# Patient Record
Sex: Male | Born: 1965 | Race: White | Hispanic: No | Marital: Married | Smoking: Never smoker
Health system: Southern US, Community
[De-identification: ages and names within clinical notes are randomized; demographics above are authoritative.]

## PROBLEM LIST (undated history)

## (undated) DIAGNOSIS — R82994 Hypercalciuria: Secondary | ICD-10-CM

## (undated) DIAGNOSIS — N200 Calculus of kidney: Secondary | ICD-10-CM

## (undated) DIAGNOSIS — M8580 Other specified disorders of bone density and structure, unspecified site: Secondary | ICD-10-CM

## (undated) HISTORY — DX: Other specified disorders of bone density and structure, unspecified site: M85.80

## (undated) HISTORY — DX: Hypercalciuria: R82.994

## (undated) HISTORY — DX: Calculus of kidney: N20.0

## (undated) HISTORY — PX: WISDOM TOOTH EXTRACTION: SHX21

## (undated) HISTORY — PX: APPENDECTOMY: SHX54

## (undated) HISTORY — PX: VASECTOMY: SHX75

---

## 2006-12-07 ENCOUNTER — Ambulatory Visit: Payer: Self-pay | Admitting: Family Medicine

## 2006-12-09 ENCOUNTER — Ambulatory Visit: Payer: Self-pay | Admitting: Family Medicine

## 2006-12-09 LAB — CONVERTED CEMR LAB
AST: 16 units/L (ref 0–37)
Albumin: 4.4 g/dL (ref 3.5–5.2)
Basophils Absolute: 0 10*3/uL (ref 0.0–0.1)
Chloride: 102 meq/L (ref 96–112)
Eosinophils Absolute: 0.3 10*3/uL (ref 0.0–0.6)
GFR calc non Af Amer: 99 mL/min
HCT: 45.9 % (ref 39.0–52.0)
MCHC: 33.6 g/dL (ref 30.0–36.0)
MCV: 92 fL (ref 78.0–100.0)
Neutrophils Relative %: 61.5 % (ref 43.0–77.0)
Platelets: 206 10*3/uL (ref 150–400)
RBC: 4.99 M/uL (ref 4.22–5.81)
Sodium: 138 meq/L (ref 135–145)
TSH: 1.08 microintl units/mL (ref 0.35–5.50)

## 2007-06-19 ENCOUNTER — Encounter: Payer: Self-pay | Admitting: Family Medicine

## 2008-08-07 ENCOUNTER — Encounter: Payer: Self-pay | Admitting: Family Medicine

## 2008-09-04 ENCOUNTER — Ambulatory Visit: Payer: Self-pay | Admitting: Family Medicine

## 2008-09-04 DIAGNOSIS — J209 Acute bronchitis, unspecified: Secondary | ICD-10-CM

## 2010-12-08 NOTE — Assessment & Plan Note (Signed)
[facility]                            [facility] OFFICE NOTE   NAME:[name], [name]                      MRN:          [id number]  DATE:12/07/2006                            DOB:          May 16, 1966    HISTORY OF PRESENT ILLNESS:  This is a 45 year old gentleman here to  establish with our practice. He has seen a physician in several years.  He has a couple of skin lesions that he would like me to check.  Otherwise, he feels fine. He has had 2 firm small lesions on the left  lower leg that have been present for many years. They do not bother him  and they do not seem to change. Also for many years, he has had a small  non-symptomatic lump on his scrotum, which does not appear to be  changing.   PAST MEDICAL HISTORY:  He has been quite healthy all his life. He has  never been hospitalized.   PAST SURGICAL HISTORY:  Vasectomy in 2002.   ALLERGIES:  NO KNOWN DRUG ALLERGIES.   CURRENT MEDICATIONS:  None by prescription. He does take fish oil  supplements and a multivitamin daily.   HABITS:  He drinks some alcohol but does not use tobacco.   SOCIAL HISTORY:  He is married with 2 children. 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   FAMILY HISTORY:  Remarkable for his father suddenly dying of a heart  attack at the age of 65. To his knowledge, his father had no prior risk  factors for heart disease whatsoever. His mother has hypertension. No  family history of diabetes or cancer.   OBJECTIVE:  VITAL SIGNS:  Height 6 foot 0 inches. Weight 172. Blood  pressure 122/82. Pulse 88 and regular.  GENERAL:  He appears to be quite healthy.  SKIN:  Free of significant lesions. He does have 2 small non-tender,  firm, dark brown lesions on his left lower leg, consistent with  dermatofibromas.  HEENT:  Eyes clear. Ears clear. Pharynx clear.  NECK:  Supple without lymphadenopathy or masses.  LUNGS:  Clear.  CARDIOVASCULAR:  Regular rate and  rhythm. Without murmur, rub, or  gallop. Distal pulses are full.  ABDOMEN:  Soft with normal bowel sounds. Non-tender, no masses.  GENITALIA:  Normal male. He is circumcised. He does have a small non-  tender well circumscribed round cystic lesion, must under the skin on  the left side of the scrotum.  EXTREMITIES:  No clubbing, cyanosis, or edema.  NEUROLOGIC:  Examination is grossly intact.   ASSESSMENT/PLAN:  1. Complete physical. He will schedule soon in a fasting state, to      have the usual laboratories performed.  2. 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 I told him that these were      quite benign and he could observe them only.  3. Sebaceous cyst on the scrotum. I told him that this was also quite      benign and no interventions were necessary. He said that he might      consider having it removed  for his peace of mind at some point and      when he decides to do that, we will set up a referral to urology.     Tera Mater. Clent Ridges, MD     SAF/MedQ  DD: 12/07/2006  DT: 12/07/2006  Job #: [id number]

## 2012-10-17 ENCOUNTER — Emergency Department (HOSPITAL_COMMUNITY): Payer: Commercial Managed Care - PPO

## 2012-10-17 ENCOUNTER — Encounter (HOSPITAL_COMMUNITY): Payer: Self-pay

## 2012-10-17 ENCOUNTER — Inpatient Hospital Stay (HOSPITAL_COMMUNITY)
Admission: EM | Admit: 2012-10-17 | Discharge: 2012-10-23 | DRG: 340 | Disposition: A | Discharge status: Home / Self Care | Payer: Commercial Managed Care - PPO | Attending: Surgery | Admitting: Surgery

## 2012-10-17 DIAGNOSIS — Z8709 Personal history of other diseases of the respiratory system: Secondary | ICD-10-CM

## 2012-10-17 DIAGNOSIS — K3533 Acute appendicitis with perforation and localized peritonitis, with abscess: Principal | ICD-10-CM | POA: Diagnosis present

## 2012-10-17 DIAGNOSIS — Z9049 Acquired absence of other specified parts of digestive tract: Secondary | ICD-10-CM

## 2012-10-17 DIAGNOSIS — K3580 Unspecified acute appendicitis: Secondary | ICD-10-CM

## 2012-10-17 DIAGNOSIS — D72829 Elevated white blood cell count, unspecified: Secondary | ICD-10-CM | POA: Diagnosis present

## 2012-10-17 LAB — COMPREHENSIVE METABOLIC PANEL
Alkaline Phosphatase: 60 U/L (ref 39–117)
BUN: 8 mg/dL (ref 6–23)
CO2: 30 mEq/L (ref 19–32)
Chloride: 99 mEq/L (ref 96–112)
Creatinine, Ser: 0.93 mg/dL (ref 0.50–1.35)
GFR calc non Af Amer: >90 mL/min (ref >90)
Glucose, Bld: 119 mg/dL — ABNORMAL HIGH (ref 70–99)
Total Bilirubin: 1 mg/dL (ref 0.3–1.2)

## 2012-10-17 LAB — CBC WITH DIFFERENTIAL/PLATELET
HCT: 42.9 % (ref 39.0–52.0)
Hemoglobin: 15.1 g/dL (ref 13.0–17.0)
Lymphocytes Relative: 6 % — ABNORMAL LOW (ref 12–46)
Lymphs Abs: 1.1 10*3/uL (ref 0.7–4.0)
MCHC: 35.2 g/dL (ref 30.0–36.0)
Monocytes Absolute: 1.6 10*3/uL — ABNORMAL HIGH (ref 0.1–1.0)
Monocytes Relative: 8 % (ref 3–12)
Neutro Abs: 16.7 10*3/uL — ABNORMAL HIGH (ref 1.7–7.7)
RBC: 4.85 MIL/uL (ref 4.22–5.81)
WBC: 19.5 10*3/uL — ABNORMAL HIGH (ref 4.0–10.5)

## 2012-10-17 LAB — LIPASE, BLOOD: Lipase: 18 U/L (ref 11–59)

## 2012-10-17 LAB — URINALYSIS, MICROSCOPIC ONLY
Bilirubin Urine: NEGATIVE
Ketones, ur: 15 mg/dL — AB
Leukocytes, UA: NEGATIVE
Nitrite: NEGATIVE
Protein, ur: 30 mg/dL — AB

## 2012-10-17 MED ORDER — ONDANSETRON HCL 4 MG/2ML IJ SOLN
4.0000 mg | Freq: Once | INTRAMUSCULAR | Status: AC
  Administered 2012-10-17: 4 mg via INTRAVENOUS
  Filled 2012-10-17: qty 2

## 2012-10-17 MED ORDER — MORPHINE SULFATE 4 MG/ML IJ SOLN
4.0000 mg | Freq: Once | INTRAMUSCULAR | Status: AC
  Administered 2012-10-17: 4 mg via INTRAVENOUS
  Filled 2012-10-17: qty 1

## 2012-10-17 MED ORDER — IOHEXOL 300 MG/ML  SOLN
50.0000 mL | Freq: Once | INTRAMUSCULAR | Status: AC | PRN
  Administered 2012-10-17: 50 mL via ORAL

## 2012-10-17 MED ORDER — IOHEXOL 300 MG/ML  SOLN
100.0000 mL | Freq: Once | INTRAMUSCULAR | Status: AC | PRN
  Administered 2012-10-17: 100 mL via INTRAVENOUS

## 2012-10-17 MED ORDER — CEFOXITIN SODIUM 1 G IV SOLR
1.0000 g | Freq: Once | INTRAVENOUS | Status: AC
  Administered 2012-10-18: 1 g via INTRAVENOUS
  Filled 2012-10-17: qty 1

## 2012-10-17 NOTE — ED Notes (Signed)
Patient presents with c/o abdominal pain and generalized malaise. Reports that he ate eggs and toast for dinner 2 nights ago. Immediately afterwards, had epigastric pain, nausea and vomiting x 2. Since then, pain has migrated to RLQ. No n/v, diarrhea or constipation. Patient has also felt "achy all over" with headaches, intermittent fevers, sweats and chills relieved by Ibuprofen. No appetite but has been eating and drinking small amounts.

## 2012-10-17 NOTE — ED Provider Notes (Signed)
History     CSN: [id number]  Arrival date & time 10/17/12  1912   First MD Initiated Contact with Patient 10/17/12 2049      Chief Complaint  Patient presents with  . Abdominal Pain    Patient is a 47 y.o. male presenting with abdominal pain. The history is provided by the patient.  Abdominal Pain Pain location:  RLQ Pain quality: aching   Pain radiates to:  Does not radiate Pain severity:  Moderate Onset quality:  Gradual Duration:  2 days Timing:  Constant Progression:  Worsening Chronicity:  New Relieved by: rest. Worsened by:  Palpation Associated symptoms: chills, fever, nausea and vomiting   Associated symptoms: no chest pain, no constipation, no cough, no diarrhea and no dysuria   pt reports pain started in epigastric region and now has migrated to RLQ He has never had this before He reports fever at home  PMH - none Surgical History - none   No family history on file.  History  Substance Use Topics  . Smoking status: Never Smoker   . Smokeless tobacco: Never Used  . Alcohol Use: Yes     Comment: occasional       Review of Systems  Constitutional: Positive for fever and chills.  Respiratory: Negative for cough.   Cardiovascular: Negative for chest pain.  Gastrointestinal: Positive for nausea, vomiting and abdominal pain. Negative for diarrhea and constipation.  Genitourinary: Negative for dysuria.  Neurological: Positive for headaches. Negative for weakness.  Psychiatric/Behavioral: Negative for agitation.  All other systems reviewed and are negative.    Allergies  Review of patient's allergies indicates no known allergies.  Home Medications   Current Outpatient Rx  Name  Route  Sig  Dispense  Refill  . ibuprofen (ADVIL,MOTRIN) 200 MG tablet   Oral   Take 400 mg by mouth every 6 (six) hours as needed for pain.           BP 114/71  Pulse 109  Temp(Src) 98.4 F (36.9 C) (Oral)  Resp 18  SpO2 98% BP 107/59  Pulse 86  Temp(Src)  98.4 F (36.9 C) (Oral)  Resp 18  SpO2 94%   Physical Exam CONSTITUTIONAL: Well developed/well nourished HEAD: Normocephalic/atraumatic EYES: EOMI/PERRL ENMT: Mucous membranes dry NECK: supple no meningeal signs CV: S1/S2 noted, no murmurs/rubs/gallops noted LUNGS: Lungs are clear to auscultation bilaterally, no apparent distress ABDOMEN: soft, moderate RLQ tenderness, no rebound or guarding GU:no cva tenderness, no inguinal hernia noted NEURO: Pt is awake/alert, moves all extremitiesx4 EXTREMITIES: pulses normal, full ROM SKIN: warm, color normal PSYCH: no abnormalities of mood noted  ED Course  Procedures  Labs Reviewed  CBC WITH DIFFERENTIAL - Abnormal; Notable for the following:    WBC 19.5 (*)    Neutrophils Relative 86 (*)    Neutro Abs 16.7 (*)    Lymphocytes Relative 6 (*)    Monocytes Absolute 1.6 (*)    All other components within normal limits  COMPREHENSIVE METABOLIC PANEL - Abnormal; Notable for the following:    Glucose, Bld 119 (*)    All other components within normal limits  LIPASE, BLOOD  URINALYSIS, MICROSCOPIC ONLY   9:12 PM Pt with focal RLQ tenderness, elevated WBC, will need CT imaging Pt agreeable He is otherwise stable at this time 10:53 PM Pt reports improvement in pain Ct imaging pending currently 11:57 PM I spoke to radiology concerning this patient CT imaging shows appendicitis I spoke to dr Dwain Sarna, will see patient.  He recommend  cefoxitin I informed patient of CT imaging and plan for admission Pt agreeable  MDM  Nursing notes including past medical history and social history reviewed and considered in documentation Labs/vital reviewed and considered         Joya Gaskins, MD 10/17/12 2358

## 2012-10-18 ENCOUNTER — Encounter (HOSPITAL_COMMUNITY): Payer: Self-pay | Admitting: *Deleted

## 2012-10-18 ENCOUNTER — Encounter (HOSPITAL_COMMUNITY): Payer: Self-pay | Admitting: Anesthesiology

## 2012-10-18 ENCOUNTER — Inpatient Hospital Stay (HOSPITAL_COMMUNITY): Payer: Commercial Managed Care - PPO | Admitting: Anesthesiology

## 2012-10-18 ENCOUNTER — Encounter (HOSPITAL_COMMUNITY): Admission: EM | Disposition: A | Discharge status: Home / Self Care | Payer: Self-pay | Source: Home / Self Care

## 2012-10-18 DIAGNOSIS — K3580 Unspecified acute appendicitis: Secondary | ICD-10-CM

## 2012-10-18 HISTORY — PX: LAPAROSCOPIC APPENDECTOMY: SHX408

## 2012-10-18 SURGERY — APPENDECTOMY, LAPAROSCOPIC
Anesthesia: General | Site: Abdomen | Wound class: Dirty or Infected

## 2012-10-18 MED ORDER — OXYCODONE HCL 5 MG/5ML PO SOLN: 5.0000 mg | Freq: Once | ORAL | Status: DC | PRN

## 2012-10-18 MED ORDER — MIDAZOLAM HCL 5 MG/5ML IJ SOLN
INTRAMUSCULAR | Status: DC | PRN
  Administered 2012-10-18: 2 mg via INTRAVENOUS

## 2012-10-18 MED ORDER — LACTATED RINGERS IV SOLN
INTRAVENOUS | Status: DC
  Administered 2012-10-18: 09:00:00 via INTRAVENOUS

## 2012-10-18 MED ORDER — LIDOCAINE HCL (CARDIAC) 20 MG/ML IV SOLN
INTRAVENOUS | Status: DC | PRN
  Administered 2012-10-18: 100 mg via INTRAVENOUS

## 2012-10-18 MED ORDER — BUPIVACAINE-EPINEPHRINE 0.25% -1:200000 IJ SOLN
INTRAMUSCULAR | Status: DC | PRN
  Administered 2012-10-18: 30 mL

## 2012-10-18 MED ORDER — ACETAMINOPHEN 650 MG RE SUPP: 650.0000 mg | Freq: Four times a day (QID) | RECTAL | Status: DC | PRN

## 2012-10-18 MED ORDER — SODIUM CHLORIDE 0.9 % IV SOLN
INTRAVENOUS | Status: DC
  Administered 2012-10-18 – 2012-10-22 (×6): via INTRAVENOUS

## 2012-10-18 MED ORDER — LACTATED RINGERS IV SOLN
INTRAVENOUS | Status: DC | PRN
  Administered 2012-10-18 (×2): via INTRAVENOUS

## 2012-10-18 MED ORDER — ARTIFICIAL TEARS OP OINT
TOPICAL_OINTMENT | OPHTHALMIC | Status: DC | PRN
  Administered 2012-10-18: 1 via OPHTHALMIC

## 2012-10-18 MED ORDER — NEOSTIGMINE METHYLSULFATE 1 MG/ML IJ SOLN
INTRAMUSCULAR | Status: DC | PRN
  Administered 2012-10-18: 5 mg via INTRAVENOUS

## 2012-10-18 MED ORDER — HYDROMORPHONE HCL PF 1 MG/ML IJ SOLN
INTRAMUSCULAR | Status: DC | PRN
  Administered 2012-10-18 (×4): .25 mg via INTRAVENOUS

## 2012-10-18 MED ORDER — ONDANSETRON HCL 4 MG/2ML IJ SOLN: 4.0000 mg | Freq: Four times a day (QID) | INTRAMUSCULAR | Status: DC | PRN

## 2012-10-18 MED ORDER — ENOXAPARIN SODIUM 40 MG/0.4ML ~~LOC~~ SOLN
40.0000 mg | SUBCUTANEOUS | Status: DC
  Administered 2012-10-19 – 2012-10-22 (×4): 40 mg via SUBCUTANEOUS
  Filled 2012-10-18 (×6): qty 0.4

## 2012-10-18 MED ORDER — IBUPROFEN 400 MG PO TABS
400.0000 mg | ORAL_TABLET | Freq: Four times a day (QID) | ORAL | Status: DC | PRN
  Administered 2012-10-20 – 2012-10-22 (×7): 400 mg via ORAL
  Filled 2012-10-18 (×7): qty 1

## 2012-10-18 MED ORDER — ACETAMINOPHEN 10 MG/ML IV SOLN
INTRAVENOUS | Status: AC
  Filled 2012-10-18: qty 100

## 2012-10-18 MED ORDER — ROCURONIUM BROMIDE 100 MG/10ML IV SOLN
INTRAVENOUS | Status: DC | PRN
  Administered 2012-10-18: 50 mg via INTRAVENOUS

## 2012-10-18 MED ORDER — OXYCODONE HCL 5 MG PO TABS
5.0000 mg | ORAL_TABLET | ORAL | Status: DC | PRN
  Administered 2012-10-21 – 2012-10-22 (×4): 5 mg via ORAL
  Filled 2012-10-18 (×4): qty 1

## 2012-10-18 MED ORDER — ONDANSETRON HCL 4 MG/2ML IJ SOLN
4.0000 mg | Freq: Four times a day (QID) | INTRAMUSCULAR | Status: DC | PRN
  Administered 2012-10-18 – 2012-10-21 (×6): 4 mg via INTRAVENOUS
  Filled 2012-10-18 (×6): qty 2

## 2012-10-18 MED ORDER — BUPIVACAINE HCL (PF) 0.25 % IJ SOLN
INTRAMUSCULAR | Status: AC
  Filled 2012-10-18: qty 30

## 2012-10-18 MED ORDER — SODIUM CHLORIDE 0.9 % IR SOLN
Status: DC | PRN
  Administered 2012-10-18: 1000 mL

## 2012-10-18 MED ORDER — GLYCOPYRROLATE 0.2 MG/ML IJ SOLN
INTRAMUSCULAR | Status: DC | PRN
  Administered 2012-10-18: .8 mg via INTRAVENOUS

## 2012-10-18 MED ORDER — MORPHINE SULFATE 2 MG/ML IJ SOLN
2.0000 mg | INTRAMUSCULAR | Status: DC | PRN
  Administered 2012-10-18 – 2012-10-19 (×10): 2 mg via INTRAVENOUS
  Filled 2012-10-18 (×11): qty 1

## 2012-10-18 MED ORDER — DEXAMETHASONE SODIUM PHOSPHATE 10 MG/ML IJ SOLN
INTRAMUSCULAR | Status: DC | PRN
  Administered 2012-10-18: 8 mg via INTRAVENOUS

## 2012-10-18 MED ORDER — ACETAMINOPHEN 325 MG PO TABS
650.0000 mg | ORAL_TABLET | Freq: Four times a day (QID) | ORAL | Status: DC | PRN
  Administered 2012-10-18: 1000 mg via ORAL
  Administered 2012-10-18 – 2012-10-20 (×2): 650 mg via ORAL
  Filled 2012-10-18 (×2): qty 2

## 2012-10-18 MED ORDER — PIPERACILLIN-TAZOBACTAM 3.375 G IVPB
3.3750 g | Freq: Three times a day (TID) | INTRAVENOUS | Status: DC
  Administered 2012-10-18 – 2012-10-23 (×15): 3.375 g via INTRAVENOUS
  Filled 2012-10-18 (×18): qty 50

## 2012-10-18 MED ORDER — BUPIVACAINE-EPINEPHRINE PF 0.25-1:200000 % IJ SOLN
INTRAMUSCULAR | Status: AC
  Filled 2012-10-18: qty 30

## 2012-10-18 MED ORDER — ONDANSETRON HCL 4 MG/2ML IJ SOLN
INTRAMUSCULAR | Status: DC | PRN
  Administered 2012-10-18: 4 mg via INTRAVENOUS

## 2012-10-18 MED ORDER — HYDROMORPHONE HCL PF 1 MG/ML IJ SOLN
0.2500 mg | INTRAMUSCULAR | Status: DC | PRN
Start: 2012-10-18 — End: 2012-10-18
  Administered 2012-10-18 (×2): 0.5 mg via INTRAVENOUS

## 2012-10-18 MED ORDER — OXYCODONE HCL 5 MG PO TABS: 5.0000 mg | ORAL_TABLET | Freq: Once | ORAL | Status: DC | PRN

## 2012-10-18 MED ORDER — HYDROMORPHONE HCL PF 1 MG/ML IJ SOLN
INTRAMUSCULAR | Status: AC
  Filled 2012-10-18: qty 1

## 2012-10-18 MED ORDER — PROPOFOL 10 MG/ML IV BOLUS
INTRAVENOUS | Status: DC | PRN
  Administered 2012-10-18: 150 mg via INTRAVENOUS
  Administered 2012-10-18 (×2): 50 mg via INTRAVENOUS

## 2012-10-18 MED ORDER — WHITE PETROLATUM GEL
Status: AC
  Administered 2012-10-18: 23:00:00
  Filled 2012-10-18: qty 5

## 2012-10-18 MED ORDER — FENTANYL CITRATE 0.05 MG/ML IJ SOLN
INTRAMUSCULAR | Status: DC | PRN
  Administered 2012-10-18 (×3): 50 ug via INTRAVENOUS
  Administered 2012-10-18: 100 ug via INTRAVENOUS

## 2012-10-18 SURGICAL SUPPLY — 54 items
ADH SKN CLS APL DERMABOND .7 (GAUZE/BANDAGES/DRESSINGS) ×1
APPLIER CLIP ROT 10 11.4 M/L (STAPLE)
APR CLP MED LRG 11.4X10 (STAPLE)
BAG SPEC RTRVL LRG 6X4 10 (ENDOMECHANICALS) ×1
BLADE SURG ROTATE 9660 (MISCELLANEOUS) ×1 IMPLANT
CANISTER SUCTION 2500CC (MISCELLANEOUS) ×2 IMPLANT
CHLORAPREP W/TINT 26ML (MISCELLANEOUS) ×2 IMPLANT
CLIP APPLIE ROT 10 11.4 M/L (STAPLE) IMPLANT
CLOTH BEACON ORANGE TIMEOUT ST (SAFETY) ×2 IMPLANT
COVER SURGICAL LIGHT HANDLE (MISCELLANEOUS) ×2 IMPLANT
CUTTER LINEAR ENDO 35 ETS (STAPLE) IMPLANT
CUTTER LINEAR ENDO 35 ETS TH (STAPLE) ×1 IMPLANT
DECANTER SPIKE VIAL GLASS SM (MISCELLANEOUS) ×2 IMPLANT
DERMABOND ADVANCED (GAUZE/BANDAGES/DRESSINGS) ×1
DERMABOND ADVANCED .7 DNX12 (GAUZE/BANDAGES/DRESSINGS) ×1 IMPLANT
DRAIN CHANNEL 19F RND (DRAIN) ×1 IMPLANT
DRAPE UTILITY 15X26 W/TAPE STR (DRAPE) ×4 IMPLANT
ELECT REM PT RETURN 9FT ADLT (ELECTROSURGICAL) ×2
ELECTRODE REM PT RTRN 9FT ADLT (ELECTROSURGICAL) ×1 IMPLANT
ENDOLOOP SUT PDS II  0 18 (SUTURE)
ENDOLOOP SUT PDS II 0 18 (SUTURE) IMPLANT
EVACUATOR SILICONE 100CC (DRAIN) ×1 IMPLANT
GLOVE BIO SURGEON STRL SZ7.5 (GLOVE) ×1 IMPLANT
GLOVE BIO SURGEON STRL SZ8 (GLOVE) ×2 IMPLANT
GLOVE BIOGEL PI IND STRL 7.0 (GLOVE) ×1 IMPLANT
GLOVE BIOGEL PI IND STRL 7.5 (GLOVE) IMPLANT
GLOVE BIOGEL PI IND STRL 8 (GLOVE) ×1 IMPLANT
GLOVE BIOGEL PI INDICATOR 7.0 (GLOVE) ×1
GLOVE BIOGEL PI INDICATOR 7.5 (GLOVE) ×1
GLOVE BIOGEL PI INDICATOR 8 (GLOVE) ×1
GOWN PREVENTION PLUS XLARGE (GOWN DISPOSABLE) ×2 IMPLANT
GOWN STRL NON-REIN LRG LVL3 (GOWN DISPOSABLE) ×4 IMPLANT
KIT BASIN OR (CUSTOM PROCEDURE TRAY) ×2 IMPLANT
KIT ROOM TURNOVER OR (KITS) ×2 IMPLANT
NS IRRIG 1000ML POUR BTL (IV SOLUTION) ×2 IMPLANT
PAD ARMBOARD 7.5X6 YLW CONV (MISCELLANEOUS) ×4 IMPLANT
POUCH SPECIMEN RETRIEVAL 10MM (ENDOMECHANICALS) ×2 IMPLANT
RELOAD /EVU35 (ENDOMECHANICALS) IMPLANT
RELOAD CUTTER ETS 35MM STAND (ENDOMECHANICALS) IMPLANT
SCALPEL HARMONIC ACE (MISCELLANEOUS) ×2 IMPLANT
SET IRRIG TUBING LAPAROSCOPIC (IRRIGATION / IRRIGATOR) ×2 IMPLANT
SPECIMEN JAR SMALL (MISCELLANEOUS) ×2 IMPLANT
SUT ETHILON 2 0 FS 18 (SUTURE) ×1 IMPLANT
SUT VIC AB 4-0 PS2 27 (SUTURE) ×2 IMPLANT
SWAB COLLECTION DEVICE MRSA (MISCELLANEOUS) ×1 IMPLANT
TOWEL OR 17X24 6PK STRL BLUE (TOWEL DISPOSABLE) ×2 IMPLANT
TOWEL OR 17X26 10 PK STRL BLUE (TOWEL DISPOSABLE) ×2 IMPLANT
TRAY FOLEY CATH 14FR (SET/KITS/TRAYS/PACK) ×2 IMPLANT
TRAY LAPAROSCOPIC (CUSTOM PROCEDURE TRAY) ×2 IMPLANT
TROCAR HASSON GELL 12X100 (TROCAR) ×2 IMPLANT
TROCAR Z-THREAD FIOS 12X100MM (TROCAR) ×2 IMPLANT
TROCAR Z-THREAD FIOS 5X100MM (TROCAR) ×2 IMPLANT
TUBE ANAEROBIC SPECIMEN COL (MISCELLANEOUS) ×1 IMPLANT
WATER STERILE IRR 1000ML POUR (IV SOLUTION) ×1 IMPLANT

## 2012-10-18 NOTE — Op Note (Addendum)
10/17/2012 - 10/18/2012  10:49 AM  PATIENT:  [name]  47 y.o. male  PRE-OPERATIVE DIAGNOSIS:  Perforated appendicitis  POST-OPERATIVE DIAGNOSIS:  Perforated appendicitis  PROCEDURE:  Procedure(s): APPENDECTOMY LAPAROSCOPIC DRAINAGE INTRA-ABDOMINAL ABSCESS  SURGEON:  Surgeon(s): Liz Malady, MD  PHYSICIAN ASSISTANT:   ASSISTANTS: none   ANESTHESIA:   local and general  EBL:  Total I/O In: 1000 [I.V.:1000] Out: 300 [Urine:250; Blood:50]  BLOOD ADMINISTERED:none  DRAINS: (1) Jackson-Pratt drain(s) with closed bulb suction in the rlq   SPECIMEN:  Excision  DISPOSITION OF SPECIMEN:  PATHOLOGY + culture to micro  COUNTS:  YES  DICTATION: .Dragon Dictation Patient was admitted with perforated appendicitis with small abscess. He is brought for arthroscopic appendectomy. Informed consent was obtained. He is on intravenous antibiotic protocol. He was identified in the preop holding area. He was brought to the operating room and general endotracheal anesthesia was a Optician, dispensing by the anesthesia staff. His abdomen was prepped and draped in sterile fashion after Foley catheter was placed by nursing. Time out procedure was done. Infraumbilical area was infiltrated with quarter percent Marcaine with epinephrine. Infraumbilical incision was made. Subcutaneous tissues were dissected down revealing the anterior fascia. This was divided along the midline and the peritoneal cavity was entered under direct vision. 0 Vicryl purse string suture was placed on the fascial opening. Hassan trocar was inserted. Abdomen was insufflated with carbon dioxide in standard fashion. Under direct vision, a 12 mm left lower quadrant and a 5 mm right mid abdomen port were placed. Local was used at each port site. Laparoscopic exploration revealed a water omentum down covering the cecum in the right lower quadrant. This was freed up entering an abscess cavity surrounding a necrotic appendicitis with  perforation one third of the way up from the base. Gross contamination was irrigated away. The base of the appendix was viable and intact. The mesoappendix was divided with harmonic scalpel achieving excellent hemostasis. Pacing the appendix was divided with Endo GIA with vascular load. Excellent staple line closure was achieved. Appendix was placed in an Endo Catch bag and removed from the abdomen via the left lower quadrant port site. Abdomen was then copiously irrigated with several liters of saline. Area remained hemostatic. Staple line remained intact. 19 [place] Blake drain was placed in the right lower quadrant and brought out through the right sided port site. Cultures of the appendix were sent. Ports were removed under direct vision. Pneumoperitoneum was released. The umbilical fascia was closed by tying the 0 Vicryl pursestring suture with care not to trap any intra-abdominal contents. Remaining 2 incisions were copiously irrigated and the skin of each was closed with running 4 Vicryl followed by Dermabond. All counts were correct. Patient tolerated procedure well without apparent complication was taken recovery in stable condition.  PATIENT DISPOSITION:  PACU - hemodynamically stable.   Delay start of Pharmacological VTE agent (>24hrs) due to surgical blood loss or risk of bleeding:  no  Violeta Gelinas, MD, MPH, FACS Pager: [phone]  3/26/201410:49 AM

## 2012-10-18 NOTE — Anesthesia Preprocedure Evaluation (Addendum)
Anesthesia Evaluation  Patient identified by MRN, date of birth, ID band Patient awake    Reviewed: Allergy & Precautions, H&P , NPO status , Patient's Chart, lab work & pertinent test results  Airway Mallampati: II  Neck ROM: full    Dental   Pulmonary          Cardiovascular     Neuro/Psych    GI/Hepatic Perforated appendicitis   Endo/Other    Renal/GU      Musculoskeletal   Abdominal   Peds  Hematology   Anesthesia Other Findings   Reproductive/Obstetrics                           Anesthesia Physical Anesthesia Plan  ASA: I  Anesthesia Plan: General   Post-op Pain Management:    Induction: Intravenous  Airway Management Planned: Oral ETT  Additional Equipment:   Intra-op Plan:   Post-operative Plan: Extubation in OR  Informed Consent: I have reviewed the patients History and Physical, chart, labs and discussed the procedure including the risks, benefits and alternatives for the proposed anesthesia with the patient or authorized representative who has indicated his/her understanding and acceptance.     Plan Discussed with: CRNA and Surgeon  Anesthesia Plan Comments:         Anesthesia Quick Evaluation

## 2012-10-18 NOTE — Progress Notes (Signed)
UR completed 

## 2012-10-18 NOTE — Transfer of Care (Signed)
Immediate Anesthesia Transfer of Care Note  Patient: [name]  Procedure(s) Performed: Procedure(s): APPENDECTOMY LAPAROSCOPIC (N/A)  Patient Location: PACU  Anesthesia Type:General  Level of Consciousness: oriented and sedated  Airway & Oxygen Therapy: Patient Spontanous Breathing and Patient connected to nasal cannula oxygen  Post-op Assessment: Report given to PACU RN, Post -op Vital signs reviewed and stable and Patient moving all extremities X 4  Post vital signs: Reviewed and stable  Complications: No apparent anesthesia complications

## 2012-10-18 NOTE — Anesthesia Procedure Notes (Signed)
Procedure Name: Intubation Date/Time: 10/18/2012 10:00 AM Performed by: Sherie Don Pre-anesthesia Checklist: Patient identified, Emergency Drugs available, Suction available, Patient being monitored and Timeout performed Patient Re-evaluated:Patient Re-evaluated prior to inductionOxygen Delivery Method: Circle system utilized Preoxygenation: Pre-oxygenation with 100% oxygen Intubation Type: IV induction Laryngoscope Size: Mac and 3 Grade View: Grade I Tube size: 8.0 mm Number of attempts: 1 Airway Equipment and Method: Stylet Placement Confirmation: ETT inserted through vocal cords under direct vision,  positive ETCO2 and breath sounds checked- equal and bilateral Secured at: 23 cm Tube secured with: Tape Dental Injury: Teeth and Oropharynx as per pre-operative assessment

## 2012-10-18 NOTE — Progress Notes (Signed)
ANTIBIOTIC CONSULT NOTE - INITIAL  Pharmacy Consult for zosyn Indication: appendicitis  No Known Allergies  Patient Measurements: Height: 6' (182.9 cm) Weight: 168 lb 6.9 oz (76.4 kg) IBW/kg (Calculated) : 77.6   Vital Signs: Temp: 102.7 F (39.3 C) (03/26 0209) Temp src: Oral (03/26 0209) BP: 113/62 mmHg (03/26 0209) Pulse Rate: 99 (03/26 0209) Intake/Output from previous day:   Intake/Output from this shift:    Labs:  Recent Labs  10/17/12 2006  WBC 19.5*  HGB 15.1  PLT 179  CREATININE 0.93   Estimated Creatinine Clearance: 106.1 ml/min (by C-G formula based on Cr of 0.93). No results found for this basename: VANCOTROUGH, VANCOPEAK, VANCORANDOM, GENTTROUGH, GENTPEAK, GENTRANDOM, TOBRATROUGH, TOBRAPEAK, TOBRARND, AMIKACINPEAK, AMIKACINTROU, AMIKACIN,  in the last 72 hours   Microbiology: No results found for this or any previous visit (from the past 720 hour(s)).  Medical History: History reviewed. No pertinent past medical history.  Medications:  Prescriptions prior to admission  Medication Sig Dispense Refill  . ibuprofen (ADVIL,MOTRIN) 200 MG tablet Take 400 mg by mouth every 6 (six) hours as needed for pain.       Assessment: 47 yo man to start zosyn for possible perforated appendicitis.  Renal function good with CrCl >100 ml/min.  Goal of Therapy:  Appropriate antibiotic therapy  Plan:  Zosyn 3.375 gm IV q8 hours. F/u clinical course, renal function and cultures.  [name] 10/18/2012,2:20 AM

## 2012-10-18 NOTE — H&P (Signed)
[name] is an 47 y.o. male.   Chief Complaint: appendicitis HPI: 55 yom who is healthy and works as Administrator, [employer] in [place] presents with 48 hour history of epigastric pain radiating now to rlq.  This has worsened and was not getting better at home.  He has had fevers, has been taking advil, one episode emesis on Sunday.  Still passing flatus, not eating at all.  History reviewed. No pertinent past medical history.  History reviewed. No pertinent past surgical history.  No family history on file. Social History:  reports that he has never smoked. He has never used smokeless tobacco. He reports that  drinks alcohol. He reports that he does not use illicit drugs.  Allergies: No Known Allergies  Meds none  Results for orders placed during the hospital encounter of 10/17/12 (from the past 48 hour(s))  CBC WITH DIFFERENTIAL     Status: Abnormal   Collection Time    10/17/12  8:06 PM      Result Value Range   WBC 19.5 (*) 4.0 - 10.5 K/uL   RBC 4.85  4.22 - 5.81 MIL/uL   Hemoglobin 15.1  13.0 - 17.0 g/dL   HCT 45.4  09.8 - 11.9 %   MCV 88.5  78.0 - 100.0 fL   MCH 31.1  26.0 - 34.0 pg   MCHC 35.2  30.0 - 36.0 g/dL   RDW 14.7  82.9 - 56.2 %   Platelets 179  150 - 400 K/uL   Neutrophils Relative 86 (*) 43 - 77 %   Neutro Abs 16.7 (*) 1.7 - 7.7 K/uL   Lymphocytes Relative 6 (*) 12 - 46 %   Lymphs Abs 1.1  0.7 - 4.0 K/uL   Monocytes Relative 8  3 - 12 %   Monocytes Absolute 1.6 (*) 0.1 - 1.0 K/uL   Eosinophils Relative 0  0 - 5 %   Eosinophils Absolute 0.0  0.0 - 0.7 K/uL   Basophils Relative 0  0 - 1 %   Basophils Absolute 0.0  0.0 - 0.1 K/uL  COMPREHENSIVE METABOLIC PANEL     Status: Abnormal   Collection Time    10/17/12  8:06 PM      Result Value Range   Sodium 137  135 - 145 mEq/L   Potassium 3.7  3.5 - 5.1 mEq/L   Chloride 99  96 - 112 mEq/L   CO2 30  19 - 32 mEq/L   Glucose, Bld 119 (*) 70 - 99 mg/dL   BUN 8  6 - 23 mg/dL   Creatinine, Ser 1.30  0.50 - 1.35 mg/dL   Calcium 9.7  8.4 - 86.5 mg/dL   Total Protein 7.4  6.0 - 8.3 g/dL   Albumin 3.9  3.5 - 5.2 g/dL   AST 15  0 - 37 U/L   ALT 13  0 - 53 U/L   Alkaline Phosphatase 60  39 - 117 U/L   Total Bilirubin 1.0  0.3 - 1.2 mg/dL   GFR calc non Af Amer >90  >90 mL/min   GFR calc Af Amer >90  >90 mL/min   Comment:            The eGFR has been calculated     using the CKD EPI equation.     This calculation has not been     validated in all clinical     situations.     eGFR's persistently     <90 mL/min signify  possible Chronic Kidney Disease.  LIPASE, BLOOD     Status: None   Collection Time    10/17/12  8:06 PM      Result Value Range   Lipase 18  11 - 59 U/L  URINALYSIS, MICROSCOPIC ONLY     Status: Abnormal   Collection Time    10/17/12 10:03 PM      Result Value Range   Color, Urine YELLOW  YELLOW   APPearance CLOUDY (*) CLEAR   Specific Gravity, Urine 1.018  1.005 - 1.030   pH 6.0  5.0 - 8.0   Glucose, UA NEGATIVE  NEGATIVE mg/dL   Hgb urine dipstick SMALL (*) NEGATIVE   Bilirubin Urine NEGATIVE  NEGATIVE   Ketones, ur 15 (*) NEGATIVE mg/dL   Protein, ur 30 (*) NEGATIVE mg/dL   Urobilinogen, UA 1.0  0.0 - 1.0 mg/dL   Nitrite NEGATIVE  NEGATIVE   Leukocytes, UA NEGATIVE  NEGATIVE   RBC / HPF 3-6  <3 RBC/hpf   Bacteria, UA RARE  RARE   Squamous Epithelial / LPF RARE  RARE   Ct Abdomen Pelvis W Contrast  10/17/2012  *RADIOLOGY REPORT*  Clinical Data: Abdominal pain.  Malaise.  Epigastric pain with nausea and vomiting.  CT ABDOMEN AND PELVIS WITH CONTRAST  Technique:  Multidetector CT imaging of the abdomen and pelvis was performed following the standard protocol during bolus administration of intravenous contrast.  Contrast: OMNIPAQUE IOHEXOL 300 MG/ML  SOLN  Comparison: None.  Findings: Inflammatory changes are present in the right lower quadrant.  Findings compatible with acute appendicitis.  There is a small appendicolith.  There is a fluid collection in the retrocecal  position, suspicious for periappendiceal abscess and ruptured appendicitis.  This fluid collection measures 15 mm x 24 mm (image number 69 series 2).  There is no intra-abdominal free air.  Lung Bases: Dependent atelectasis.  Liver:  11 mm lesion in the anterior right hepatic lobe compatible with a cyst.  Mild periportal edema appears present.  Spleen:  Normal.  Gallbladder:  Normal.  Common bile duct:  Normal.  Pancreas:  Normal.  Adrenal glands:  Normal.  Kidneys:  Nonobstructing left renal collecting system calculi are present.  Largest is in the left upper pole measuring 4 mm.  The smaller calculi in the left inferior renal pole.  1 cm right upper pole simple renal cyst.  The ureters appear within normal limits. Delayed renal excretion of contrast is normal.  Stomach:  Normal.  Small bowel:  Normal. No obstruction.  Colon:   See appendix findings above.  Otherwise the colon appears within normal limits.  Pelvic Genitourinary:  Urinary bladder appears normal.  Small amount of free fluid in the anatomic pelvis.  No inguinal adenopathy.  Ileocolic lymph nodes appear reactive.  Bones:  No aggressive osseous lesions.  Vasculature: Normal.  IMPRESSION: 1.  Findings consistent with perforated acute appendicitis. Appendiceal perforation with small periappendiceal abscess measuring 15 mm x 24 mm.  Tiny amount of extraluminal gas is present adjacent to the cecum compatible with contained perforation into the mesoappendix/mesocolon. 2.  Nonobstructing left renal calculi. 3.  Low density hepatic lesion likely represents cyst.  Right upper pole renal cyst.  Critical Value/emergent results were called by telephone at the time of interpretation on 10/17/2012 at 2349 hours to Dr. Bebe Shaggy, who verbally acknowledged these results.   Original Report Authenticated By: Andreas Newport, M.D.     Review of Systems  Constitutional: Positive for fever.  Gastrointestinal: Positive  for nausea, vomiting, abdominal pain and  constipation. Negative for diarrhea.    Blood pressure 111/61, pulse 101, temperature 99.4 F (37.4 C), temperature source Oral, resp. rate 16, SpO2 95.00%. Physical Exam  Vitals reviewed. Constitutional: He appears well-developed and well-nourished.  Eyes: No scleral icterus.  Cardiovascular: Regular rhythm and normal heart sounds.  Tachycardia present.   Respiratory: Effort normal and breath sounds normal. He has no wheezes. He has no rales.  GI: Normal appearance. He exhibits no distension. Bowel sounds are decreased. There is tenderness in the right lower quadrant. There is tenderness at McBurney's point.     Assessment/Plan Appendicitis, possible perforation  It looks like by ct scan he has small perforation with small abscess.  One option is nonoperative but I don't think this is best for him.  His failure rate is considerable given small abscess and findings on ct as shown in literature.  I think would be best to proceed with attempt at lap appy with possible open appy.  We discussed this and he is in agreement.  I cannot do this right now due to emergency so likely will be done first thing in am by my partner Dr. Janee Morn.  Will admit, place on abx.  Dmani Mizer 10/18/2012, 1:17 AM

## 2012-10-18 NOTE — Progress Notes (Signed)
Subjective: RLQ pain, no nausea  Objective: Vital signs in last 24 hours: Temp:  [98.4 F (36.9 C)-102.9 F (39.4 C)] 99.9 F (37.7 C) (03/26 0612) Pulse Rate:  [86-109] 89 (03/26 0612) Resp:  [16-18] 16 (03/26 0612) BP: (107-115)/(58-72) 112/61 mmHg (03/26 0612) SpO2:  [91 %-98 %] 97 % (03/26 0612) Weight:  [76.4 kg (168 lb 6.9 oz)] 76.4 kg (168 lb 6.9 oz) (03/26 0209) Last BM Date: 10/15/12  Intake/Output from previous day: 03/25 0701 - 03/26 0700 In: 500 [I.V.:450; IV Piggyback:50] Out: 400 [Urine:400] Intake/Output this shift:    General appearance: alert and cooperative Resp: clear to auscultation bilaterally GI: soft, tender RLQ without guarding, no generalized peritoneal signs  Lab Results:   Recent Labs  10/17/12 2006  WBC 19.5*  HGB 15.1  HCT 42.9  PLT 179   BMET  Recent Labs  10/17/12 2006  NA 137  K 3.7  CL 99  CO2 30  GLUCOSE 119*  BUN 8  CREATININE 0.93  CALCIUM 9.7   PT/INR No results found for this basename: LABPROT, INR,  in the last 72 hours ABG No results found for this basename: PHART, PCO2, PO2, HCO3,  in the last 72 hours  Studies/Results: Ct Abdomen Pelvis W Contrast  10/17/2012  *RADIOLOGY REPORT*  Clinical Data: Abdominal pain.  Malaise.  Epigastric pain with nausea and vomiting.  CT ABDOMEN AND PELVIS WITH CONTRAST  Technique:  Multidetector CT imaging of the abdomen and pelvis was performed following the standard protocol during bolus administration of intravenous contrast.  Contrast: OMNIPAQUE IOHEXOL 300 MG/ML  SOLN  Comparison: None.  Findings: Inflammatory changes are present in the right lower quadrant.  Findings compatible with acute appendicitis.  There is a small appendicolith.  There is a fluid collection in the retrocecal position, suspicious for periappendiceal abscess and ruptured appendicitis.  This fluid collection measures 15 mm x 24 mm (image number 69 series 2).  There is no intra-abdominal free air.  Lung  Bases: Dependent atelectasis.  Liver:  11 mm lesion in the anterior right hepatic lobe compatible with a cyst.  Mild periportal edema appears present.  Spleen:  Normal.  Gallbladder:  Normal.  Common bile duct:  Normal.  Pancreas:  Normal.  Adrenal glands:  Normal.  Kidneys:  Nonobstructing left renal collecting system calculi are present.  Largest is in the left upper pole measuring 4 mm.  The smaller calculi in the left inferior renal pole.  1 cm right upper pole simple renal cyst.  The ureters appear within normal limits. Delayed renal excretion of contrast is normal.  Stomach:  Normal.  Small bowel:  Normal. No obstruction.  Colon:   See appendix findings above.  Otherwise the colon appears within normal limits.  Pelvic Genitourinary:  Urinary bladder appears normal.  Small amount of free fluid in the anatomic pelvis.  No inguinal adenopathy.  Ileocolic lymph nodes appear reactive.  Bones:  No aggressive osseous lesions.  Vasculature: Normal.  IMPRESSION: 1.  Findings consistent with perforated acute appendicitis. Appendiceal perforation with small periappendiceal abscess measuring 15 mm x 24 mm.  Tiny amount of extraluminal gas is present adjacent to the cecum compatible with contained perforation into the mesoappendix/mesocolon. 2.  Nonobstructing left renal calculi. 3.  Low density hepatic lesion likely represents cyst.  Right upper pole renal cyst.  Critical Value/emergent results were called by telephone at the time of interpretation on 10/17/2012 at 2349 hours to Dr. Bebe Shaggy, who verbally acknowledged these results.  Original Report Authenticated By: Andreas Newport, M.D.     Anti-infectives: Anti-infectives   Start     Dose/Rate Route Frequency Ordered Stop   10/18/12 0230  piperacillin-tazobactam (ZOSYN) IVPB 3.375 g     3.375 g 12.5 mL/hr over 240 Minutes Intravenous Every 8 hours 10/18/12 0219     10/18/12 0000  cefOXitin (MEFOXIN) 1 g in dextrose 5 % 50 mL IVPB     1 g 100 mL/hr over 30  Minutes Intravenous  Once 10/17/12 2356 10/18/12 0101      Assessment/Plan: s/p Procedure(s): APPENDECTOMY LAPAROSCOPIC (N/A) Perforated appendicitis with small abscess - to OR this AM for lap appy/possible open.  Procedure, risks, benefits D/W patient.  He agrees.  LOS: 1 day    Kiondre Grenz E 10/18/2012

## 2012-10-18 NOTE — Anesthesia Postprocedure Evaluation (Signed)
Anesthesia Post Note  Patient: [name]  Procedure(s) Performed: Procedure(s) (LRB): APPENDECTOMY LAPAROSCOPIC (N/A)  Anesthesia type: General  Patient location: PACU  Post pain: Pain level controlled and Adequate analgesia  Post assessment: Post-op Vital signs reviewed, Patient's Cardiovascular Status Stable, Respiratory Function Stable, Patent Airway and Pain level controlled  Last Vitals:  Filed Vitals:   10/18/12 1145  BP:   Pulse: 102  Temp:   Resp: 15    Post vital signs: Reviewed and stable  Level of consciousness: awake, alert  and oriented  Complications: No apparent anesthesia complications

## 2012-10-18 NOTE — Preoperative (Addendum)
Beta Blockers   Reason not to administer Beta Blockers:Not Applicable 

## 2012-10-19 ENCOUNTER — Encounter (HOSPITAL_COMMUNITY): Payer: Self-pay | Admitting: General Surgery

## 2012-10-19 LAB — CBC
MCH: 31.2 pg (ref 26.0–34.0)
MCV: 91.1 fL (ref 78.0–100.0)
Platelets: 169 10*3/uL (ref 150–400)
RBC: 4.26 MIL/uL (ref 4.22–5.81)
RDW: 12.8 % (ref 11.5–15.5)

## 2012-10-19 LAB — BASIC METABOLIC PANEL
BUN: 12 mg/dL (ref 6–23)
Chloride: 100 mEq/L (ref 96–112)
GFR calc Af Amer: >90 mL/min (ref >90)
Potassium: 5 mEq/L (ref 3.5–5.1)
Sodium: 137 mEq/L (ref 135–145)

## 2012-10-19 MED ORDER — ZOLPIDEM TARTRATE 5 MG PO TABS
10.0000 mg | ORAL_TABLET | Freq: Every evening | ORAL | Status: DC | PRN
  Filled 2012-10-19: qty 2

## 2012-10-19 MED ORDER — CHLORPROMAZINE HCL 25 MG PO TABS
25.0000 mg | ORAL_TABLET | Freq: Four times a day (QID) | ORAL | Status: DC | PRN
  Administered 2012-10-19: 25 mg via ORAL
  Filled 2012-10-19: qty 1

## 2012-10-19 MED ORDER — DOCUSATE SODIUM 100 MG PO CAPS: 100.0000 mg | ORAL_CAPSULE | Freq: Two times a day (BID) | ORAL | Status: DC | PRN

## 2012-10-19 MED ORDER — METHOCARBAMOL 100 MG/ML IJ SOLN
1000.0000 mg | Freq: Three times a day (TID) | INTRAVENOUS | Status: DC | PRN
  Administered 2012-10-19 – 2012-10-22 (×2): 1000 mg via INTRAVENOUS
  Filled 2012-10-19 (×3): qty 10

## 2012-10-19 MED ORDER — HYDRALAZINE HCL 25 MG PO TABS: 25.0000 mg | ORAL_TABLET | Freq: Four times a day (QID) | ORAL | Status: DC | PRN

## 2012-10-19 NOTE — Progress Notes (Signed)
Sore and bloated. Some abdominal wall MM spasms, dry mouth.    Try Robaxin and gum I also spoke to his mother-in law Patient examined and I agree with the assessment and plan  Violeta Gelinas, MD, MPH, FACS Pager: [phone]  10/19/2012 10:19 AM

## 2012-10-19 NOTE — Progress Notes (Signed)
1 Day Post-Op  Subjective: Pt is in a lot of pain this am.  Pt was also a little nauseated.  Pt is up in chair, but not ambulating well.  He is tolerating some water, ginger ale, and ice chips.  Pt denies passing gas yet this am.    Objective: Vital signs in last 24 hours: Temp:  [98.7 F (37.1 C)-101.4 F (38.6 C)] 99.5 F (37.5 C) (03/27 0514) Pulse Rate:  [87-112] 90 (03/27 0514) Resp:  [8-20] 18 (03/27 0514) BP: (105-149)/(59-76) 123/72 mmHg (03/27 0514) SpO2:  [93 %-99 %] 98 % (03/27 0514) Last BM Date: 10/15/12  Intake/Output from previous day: 03/26 0701 - 03/27 0700 In: 3081 [I.V.:2981; IV Piggyback:100] Out: 2010 [Urine:1775; Drains:185; Blood:50] Intake/Output this shift: Total I/O In: -  Out: 350 [Urine:350]   PE: Gen:  Alert, NAD, pleasant Abd: Soft, moderately tender in RLQ, ND, few BS, no HSM, incisions C/D/I, drain with both sanguinous and yellowish milky drainage  Lab Results:   Recent Labs  10/17/12 2006 10/19/12 0425  WBC 19.5* 12.9*  HGB 15.1 13.3  HCT 42.9 38.8*  PLT 179 169   BMET  Recent Labs  10/17/12 2006 10/19/12 0425  NA 137 137  K 3.7 5.0  CL 99 100  CO2 30 30  GLUCOSE 119* 114*  BUN 8 12  CREATININE 0.93 1.00  CALCIUM 9.7 9.4   PT/INR No results found for this basename: LABPROT, INR,  in the last 72 hours CMP     Component Value Date/Time   NA 137 10/19/2012 0425   K 5.0 10/19/2012 0425   CL 100 10/19/2012 0425   CO2 30 10/19/2012 0425   GLUCOSE 114* 10/19/2012 0425   BUN 12 10/19/2012 0425   CREATININE 1.00 10/19/2012 0425   CALCIUM 9.4 10/19/2012 0425   PROT 7.4 10/17/2012 2006   ALBUMIN 3.9 10/17/2012 2006   AST 15 10/17/2012 2006   ALT 13 10/17/2012 2006   ALKPHOS 60 10/17/2012 2006   BILITOT 1.0 10/17/2012 2006   GFRNONAA 88* 10/19/2012 0425   GFRAA >90 10/19/2012 0425   Lipase     Component Value Date/Time   LIPASE 18 10/17/2012 2006       Studies/Results: Ct Abdomen Pelvis W Contrast  10/17/2012  *RADIOLOGY  REPORT*  Clinical Data: Abdominal pain.  Malaise.  Epigastric pain with nausea and vomiting.  CT ABDOMEN AND PELVIS WITH CONTRAST  Technique:  Multidetector CT imaging of the abdomen and pelvis was performed following the standard protocol during bolus administration of intravenous contrast.  Contrast: OMNIPAQUE IOHEXOL 300 MG/ML  SOLN  Comparison: None.  Findings: Inflammatory changes are present in the right lower quadrant.  Findings compatible with acute appendicitis.  There is a small appendicolith.  There is a fluid collection in the retrocecal position, suspicious for periappendiceal abscess and ruptured appendicitis.  This fluid collection measures 15 mm x 24 mm (image number 69 series 2).  There is no intra-abdominal free air.  Lung Bases: Dependent atelectasis.  Liver:  11 mm lesion in the anterior right hepatic lobe compatible with a cyst.  Mild periportal edema appears present.  Spleen:  Normal.  Gallbladder:  Normal.  Common bile duct:  Normal.  Pancreas:  Normal.  Adrenal glands:  Normal.  Kidneys:  Nonobstructing left renal collecting system calculi are present.  Largest is in the left upper pole measuring 4 mm.  The smaller calculi in the left inferior renal pole.  1 cm right upper pole simple  renal cyst.  The ureters appear within normal limits. Delayed renal excretion of contrast is normal.  Stomach:  Normal.  Small bowel:  Normal. No obstruction.  Colon:   See appendix findings above.  Otherwise the colon appears within normal limits.  Pelvic Genitourinary:  Urinary bladder appears normal.  Small amount of free fluid in the anatomic pelvis.  No inguinal adenopathy.  Ileocolic lymph nodes appear reactive.  Bones:  No aggressive osseous lesions.  Vasculature: Normal.  IMPRESSION: 1.  Findings consistent with perforated acute appendicitis. Appendiceal perforation with small periappendiceal abscess measuring 15 mm x 24 mm.  Tiny amount of extraluminal gas is present adjacent to the cecum  compatible with contained perforation into the mesoappendix/mesocolon. 2.  Nonobstructing left renal calculi. 3.  Low density hepatic lesion likely represents cyst.  Right upper pole renal cyst.  Critical Value/emergent results were called by telephone at the time of interpretation on 10/17/2012 at 2349 hours to Dr. Bebe Shaggy, who verbally acknowledged these results.   Original Report Authenticated By: Andreas Newport, M.D.     Anti-infectives: Anti-infectives   Start     Dose/Rate Route Frequency Ordered Stop   10/18/12 0230  piperacillin-tazobactam (ZOSYN) IVPB 3.375 g     3.375 g 12.5 mL/hr over 240 Minutes Intravenous Every 8 hours 10/18/12 0219     10/18/12 0000  cefOXitin (MEFOXIN) 1 g in dextrose 5 % 50 mL IVPB     1 g 100 mL/hr over 30 Minutes Intravenous  Once 10/17/12 2356 10/18/12 0101       Assessment/Plan POD #1 s/p Lap appy and drainage of intra-abdominal abscesses 1.  Will need to continue IV abx until fever and wbc are normal 2.  Will likely be here until Sunday or Monday as long as bowel function returns 3.  IVF, pain control, antibiotics, antiemetics 4.  Ambulation and IS  Leukocytosis and fevers - improving    LOS: 2 days    DORT, Aundra Millet 10/19/2012, 7:53 AM Pager: [phone]

## 2012-10-20 LAB — WOUND CULTURE

## 2012-10-20 LAB — CBC
HCT: 36.9 % — ABNORMAL LOW (ref 39.0–52.0)
Hemoglobin: 12 g/dL — ABNORMAL LOW (ref 13.0–17.0)
MCHC: 32.5 g/dL (ref 30.0–36.0)
RBC: 4.08 MIL/uL — ABNORMAL LOW (ref 4.22–5.81)

## 2012-10-20 NOTE — Progress Notes (Signed)
2 Days Post-Op  Subjective: Pt feels overall better today.  Still with abdominal pain, no N/V.  Appetite low.  C/o abdominal distension.  Hiccups improved, no flatus or BM yet.  Ambulating through halls. Tolerating clears.  Objective: Vital signs in last 24 hours: Temp:  [98.2 F (36.8 C)-98.8 F (37.1 C)] 98.6 F (37 C) (03/28 0552) Pulse Rate:  [75-86] 86 (03/28 0552) Resp:  [18] 18 (03/28 0552) BP: (108-122)/(66-72) 110/66 mmHg (03/28 0552) SpO2:  [93 %-94 %] 93 % (03/28 0552) Last BM Date: 10/18/12  Intake/Output from previous day: 03/27 0701 - 03/28 0700 In: 1150 [I.V.:1000; IV Piggyback:150] Out: 2315 [Urine:1685; Drains:630] Intake/Output this shift:    PE: Gen:  Alert, NAD, pleasant Card:  RRR, no M/G/R heard Pulm:  CTA, no W/R/R Abd: Soft, moderately tender, ND, diminished BS, no HSM, incisions C/D/I, drain with serous drainage, less purulent drainage   Lab Results:   Recent Labs  10/17/12 2006 10/19/12 0425  WBC 19.5* 12.9*  HGB 15.1 13.3  HCT 42.9 38.8*  PLT 179 169   BMET  Recent Labs  10/17/12 2006 10/19/12 0425  NA 137 137  K 3.7 5.0  CL 99 100  CO2 30 30  GLUCOSE 119* 114*  BUN 8 12  CREATININE 0.93 1.00  CALCIUM 9.7 9.4   PT/INR No results found for this basename: LABPROT, INR,  in the last 72 hours CMP     Component Value Date/Time   NA 137 10/19/2012 0425   K 5.0 10/19/2012 0425   CL 100 10/19/2012 0425   CO2 30 10/19/2012 0425   GLUCOSE 114* 10/19/2012 0425   BUN 12 10/19/2012 0425   CREATININE 1.00 10/19/2012 0425   CALCIUM 9.4 10/19/2012 0425   PROT 7.4 10/17/2012 2006   ALBUMIN 3.9 10/17/2012 2006   AST 15 10/17/2012 2006   ALT 13 10/17/2012 2006   ALKPHOS 60 10/17/2012 2006   BILITOT 1.0 10/17/2012 2006   GFRNONAA 88* 10/19/2012 0425   GFRAA >90 10/19/2012 0425   Lipase     Component Value Date/Time   LIPASE 18 10/17/2012 2006       Studies/Results: No results found.  Anti-infectives: Anti-infectives   Start      Dose/Rate Route Frequency Ordered Stop   10/18/12 0230  piperacillin-tazobactam (ZOSYN) IVPB 3.375 g     3.375 g 12.5 mL/hr over 240 Minutes Intravenous Every 8 hours 10/18/12 0219     10/18/12 0000  cefOXitin (MEFOXIN) 1 g in dextrose 5 % 50 mL IVPB     1 g 100 mL/hr over 30 Minutes Intravenous  Once 10/17/12 2356 10/18/12 0101       Assessment/Plan POD #1 s/p Lap appy and drainage of intra-abdominal abscesses  1. Will need to continue IV abx until fever and wbc are normal  2. Will likely be here until Sunday or Monday as long as bowel function returns  3. IVF, pain control, antibiotics, antiemetics  4. Ambulation and IS   Leukocytosis and fevers - no fevers, recheck cbc today Abdominal distension - start colace, need to allow ileus to resolve Burping - thorazine prn    LOS: 3 days    [name] 10/20/2012, 7:22 AM Pager: [phone]

## 2012-10-20 NOTE — Progress Notes (Signed)
Patient examined and I agree with the assessment and plan Some flatus, fever better.  I also spoke to his wife at the bedside Violeta Gelinas, MD, MPH, FACS Pager: [phone]  10/20/2012 4:04 PM

## 2012-10-21 LAB — CBC
HCT: 35.6 % — ABNORMAL LOW (ref 39.0–52.0)
Hemoglobin: 12.4 g/dL — ABNORMAL LOW (ref 13.0–17.0)
MCH: 30.8 pg (ref 26.0–34.0)
MCV: 88.3 fL (ref 78.0–100.0)
RBC: 4.03 MIL/uL — ABNORMAL LOW (ref 4.22–5.81)

## 2012-10-21 NOTE — Progress Notes (Signed)
Agree with A&P of EW,PA Passing gas, mild distention, tender c/w surgery Path: Diagnosis Appendix, Other than Incidental - ACUTE SUPPURATIVE APPENDICITIS. [place] RUND DO Pathologist, Electronic Signature

## 2012-10-21 NOTE — Progress Notes (Addendum)
Patient ID: [name], male   DOB: 10-23-1965, 47 y.o.   MRN: [id number] 3 Days Post-Op  Subjective: Doing ok, had 2 BMs, not really having nausea just kinda poor appetite, pain present but controlled, walking in halls, denies fevers  Objective: Vital signs in last 24 hours: Temp:  [98.2 F (36.8 C)-99.1 F (37.3 C)] 98.6 F (37 C) (03/29 4098) Pulse Rate:  [62-79] 79 (03/29 0613) Resp:  [18] 18 (03/29 0613) BP: (106-110)/(61-71) 110/71 mmHg (03/29 0613) SpO2:  [93 %-98 %] 93 % (03/29 0613) Last BM Date: 10/18/12  Intake/Output from previous day: 03/28 0701 - 03/29 0700 In: 1700 [I.V.:1700] Out: 1340 [Urine:975; Drains:365] Intake/Output this shift: Total I/O In: -  Out: 60 [Drains:60]  PE: Abd: mildly bloated, tender over incisions but no peritonitis, faint bowel sounds, drain with cloudy output 365cc/24hrs General: awake, alert, NAD  Lab Results:   Recent Labs  10/19/12 0425 10/20/12 0922  WBC 12.9* 14.7*  HGB 13.3 12.0*  HCT 38.8* 36.9*  PLT 169 213   BMET  Recent Labs  10/19/12 0425  NA 137  K 5.0  CL 100  CO2 30  GLUCOSE 114*  BUN 12  CREATININE 1.00  CALCIUM 9.4   PT/INR No results found for this basename: LABPROT, INR,  in the last 72 hours CMP     Component Value Date/Time   NA 137 10/19/2012 0425   K 5.0 10/19/2012 0425   CL 100 10/19/2012 0425   CO2 30 10/19/2012 0425   GLUCOSE 114* 10/19/2012 0425   BUN 12 10/19/2012 0425   CREATININE 1.00 10/19/2012 0425   CALCIUM 9.4 10/19/2012 0425   PROT 7.4 10/17/2012 2006   ALBUMIN 3.9 10/17/2012 2006   AST 15 10/17/2012 2006   ALT 13 10/17/2012 2006   ALKPHOS 60 10/17/2012 2006   BILITOT 1.0 10/17/2012 2006   GFRNONAA 88* 10/19/2012 0425   GFRAA >90 10/19/2012 0425   Lipase     Component Value Date/Time   LIPASE 18 10/17/2012 2006       Studies/Results: No results found.  Anti-infectives: Anti-infectives   Start     Dose/Rate Route Frequency Ordered Stop   10/18/12 0230   piperacillin-tazobactam (ZOSYN) IVPB 3.375 g     3.375 g 12.5 mL/hr over 240 Minutes Intravenous Every 8 hours 10/18/12 0219     10/18/12 0000  cefOXitin (MEFOXIN) 1 g in dextrose 5 % 50 mL IVPB     1 g 100 mL/hr over 30 Minutes Intravenous  Once 10/17/12 2356 10/18/12 0101       Assessment/Plan 1. POD#2-lap appy: overall doing well, will advance diet to fulls, if n/v develops make NPO, drain with large output of cloudy yellowish drainage, leave for now, OOB and ambulate, IV Zosyn (needs total of 10 days Abx), CBC pending, follow this   LOS: 4 days    Clementina Mareno 10/21/2012

## 2012-10-22 LAB — CBC
HCT: 36.6 % — ABNORMAL LOW (ref 39.0–52.0)
Hemoglobin: 12.5 g/dL — ABNORMAL LOW (ref 13.0–17.0)
MCH: 30.7 pg (ref 26.0–34.0)
MCHC: 34.2 g/dL (ref 30.0–36.0)
MCV: 89.9 fL (ref 78.0–100.0)
Platelets: 254 10*3/uL (ref 150–400)
RBC: 4.07 MIL/uL — ABNORMAL LOW (ref 4.22–5.81)
RDW: 12.7 % (ref 11.5–15.5)
WBC: 11.1 10*3/uL — ABNORMAL HIGH (ref 4.0–10.5)

## 2012-10-22 NOTE — Progress Notes (Signed)
Patient ID: [name], male   DOB: 05-01-1966, 47 y.o.   MRN: [id number]  4 Days Post-Op  Subjective: Doing ok, passing flatus and having BMs, pain is mostly gas like not really surgical pain, walking in halls, denies fevers  Objective: Vital signs in last 24 hours: Temp:  [98.1 F (36.7 C)-98.6 F (37 C)] 98.1 F (36.7 C) (03/30 0503) Pulse Rate:  [51-67] 51 (03/30 0503) Resp:  [16-17] 17 (03/30 0503) BP: (107-120)/(71-74) 120/74 mmHg (03/30 0503) SpO2:  [95 %-96 %] 95 % (03/30 0503) Last BM Date: 10/21/12  Intake/Output from previous day: 03/29 0701 - 03/30 0700 In: 1556.7 [I.V.:1556.7] Out: 250 [Drains:250] Intake/Output this shift:    PE: Abd: mildly bloated, mildly tender over incisions but no peritonitis, faint bowel sounds, drain clearing up 250cc/24hrs,  General: awake, alert, NAD  Lab Results:   Recent Labs  10/21/12 0707 10/22/12 0600  WBC 13.8* 11.1*  HGB 12.4* 12.5*  HCT 35.6* 36.6*  PLT 235 254   BMET No results found for this basename: NA, K, CL, CO2, GLUCOSE, BUN, CREATININE, CALCIUM,  in the last 72 hours PT/INR No results found for this basename: LABPROT, INR,  in the last 72 hours CMP     Component Value Date/Time   NA 137 10/19/2012 0425   K 5.0 10/19/2012 0425   CL 100 10/19/2012 0425   CO2 30 10/19/2012 0425   GLUCOSE 114* 10/19/2012 0425   BUN 12 10/19/2012 0425   CREATININE 1.00 10/19/2012 0425   CALCIUM 9.4 10/19/2012 0425   PROT 7.4 10/17/2012 2006   ALBUMIN 3.9 10/17/2012 2006   AST 15 10/17/2012 2006   ALT 13 10/17/2012 2006   ALKPHOS 60 10/17/2012 2006   BILITOT 1.0 10/17/2012 2006   GFRNONAA 88* 10/19/2012 0425   GFRAA >90 10/19/2012 0425   Lipase     Component Value Date/Time   LIPASE 18 10/17/2012 2006       Studies/Results: No results found.  Anti-infectives: Anti-infectives   Start     Dose/Rate Route Frequency Ordered Stop   10/18/12 0230  piperacillin-tazobactam (ZOSYN) IVPB 3.375 g     3.375 g 12.5 mL/hr over 240  Minutes Intravenous Every 8 hours 10/18/12 0219     10/18/12 0000  cefOXitin (MEFOXIN) 1 g in dextrose 5 % 50 mL IVPB     1 g 100 mL/hr over 30 Minutes Intravenous  Once 10/17/12 2356 10/18/12 0101       Assessment/Plan 1. POD#3-lap appy: overall doing well, will advance diet to soft,  Drain output high but now looks more serous, WBC improving, OOB and ambulate, IV Zosyn (needs total of 10 days Abx), ?switch to PO abx tomorrow if still doing well, recheck CBC in am    LOS: 5 days    Cherica Heiden 10/22/2012

## 2012-10-22 NOTE — Progress Notes (Signed)
Agree with A&P of EW,PA. He is less distended today than yesterday, seems to be progressing.

## 2012-10-22 NOTE — Progress Notes (Signed)
Pt given JP drain instruction sheet and understands and demonstrates how to empty JP drain successfully.

## 2012-10-23 LAB — CBC
HCT: 39.6 % (ref 39.0–52.0)
Hemoglobin: 13.4 g/dL (ref 13.0–17.0)
MCH: 30.4 pg (ref 26.0–34.0)
MCHC: 33.8 g/dL (ref 30.0–36.0)
MCV: 89.8 fL (ref 78.0–100.0)
Platelets: 302 10*3/uL (ref 150–400)
RBC: 4.41 MIL/uL (ref 4.22–5.81)
RDW: 12.6 % (ref 11.5–15.5)
WBC: 12.5 10*3/uL — ABNORMAL HIGH (ref 4.0–10.5)

## 2012-10-23 LAB — ANAEROBIC CULTURE

## 2012-10-23 MED ORDER — METRONIDAZOLE 500 MG PO TABS: 500.0000 mg | ORAL_TABLET | Freq: Three times a day (TID) | ORAL | Status: DC

## 2012-10-23 MED ORDER — CIPROFLOXACIN HCL 500 MG PO TABS: 500.0000 mg | ORAL_TABLET | Freq: Two times a day (BID) | ORAL | Status: DC

## 2012-10-23 MED ORDER — TRAMADOL HCL 50 MG PO TABS: 50.0000 mg | ORAL_TABLET | Freq: Three times a day (TID) | ORAL | Status: DC | PRN

## 2012-10-23 NOTE — Discharge Summary (Signed)
   Physician Discharge Summary  Patient ID: [name] MRN: [id number] DOB/AGE: 48/25/1967 47 y.o.  Admit date: 10/17/2012 Discharge date: 10/23/2012  Admitting Diagnosis: Perforated appendicitis with abscess  Discharge Diagnosis Patient Active Problem List   Diagnosis Date Noted  . Appendicitis with abscess 10/17/2012  . S/P appy 10/17/2012  . ACUTE BRONCHITIS 09/04/2008    Consultants None  Imaging: No results found.  Procedures Dr. Janee Morn (10/18/12) Laparoscopic appendectomy  Hospital Course:  47 y/o male who is healthy and works as Administrator, [employer] in [place] presents with 48 hour history of epigastric pain radiating now to rlq. This has worsened and was not getting better at home. He has had fevers, has been taking advil, one episode emesis on Sunday. Still passing flatus, not eating at all.  Workup showed a perforated appendix with abscess and leukocytosis.  Patient was admitted and underwent procedure listed above.  Tolerated procedure well and was transferred to the floor.  The patient experienced a post-op ileus which resolved after a few days.  Diet was advanced as tolerated.  On POD #5, the patient was voiding well, tolerating diet, ambulating well, pain well controlled, vital signs stable, incisions c/d/i and felt stable for discharge home.  Patient will follow up in our office on 10/25/12 for drain removal and knows to call with questions or concerns.  Will need to take Cipro 500mg  BID for 10 days and Flagyl 500mg  TID for 10 days.  Physical Exam: General:  Alert, NAD, pleasant, comfortable Abd:  Soft, ND, mild tenderness, incisions C/D/I, drain with clear yellow serous drainage     Medication List    TAKE these medications       ciprofloxacin 500 MG tablet  Commonly known as:  CIPRO  Take 1 tablet (500 mg total) by mouth 2 (two) times daily.     ibuprofen 200 MG tablet  Commonly known as:  ADVIL,MOTRIN  Take 400 mg by mouth every 6 (six) hours as needed for pain.      metroNIDAZOLE 500 MG tablet  Commonly known as:  FLAGYL  Take 1 tablet (500 mg total) by mouth 3 (three) times daily.     traMADol 50 MG tablet  Commonly known as:  ULTRAM  Take 1-2 tablets (50-100 mg total) by mouth every 8 (eight) hours as needed for pain.         Follow-up Information   Follow up with Liz Malady, MD On 10/25/2012. (APPT AT 10/25/12 FOR DRAIN REMOVAL, ARRIVE AT 8:30 FOR CHECK IN)    Contact information:   [address] [place] [address] [phone]       Signed: Candiss Norse [facility] [phone]  10/23/2012, 8:05 AM

## 2012-10-23 NOTE — Progress Notes (Signed)
   Physician Discharge Summary  Patient ID: [name] MRN: [id number] DOB/AGE: 11/28/1965 46 y.o.  Admit date: 10/17/2012 Discharge date: 10/23/2012  Admitting Diagnosis: Perforated appendicitis with abscess  Discharge Diagnosis Patient Active Problem List   Diagnosis Date Noted  . Appendicitis with abscess 10/17/2012  . S/P appy 10/17/2012  . ACUTE BRONCHITIS 09/04/2008    Consultants None  Imaging: No results found.  Procedures Dr. 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 This has worsened and was not getting better at home. He has had fevers, has been taking advil, one episode emesis on Sunday. Still passing flatus, not eating at all.  Workup showed a perforated appendix with abscess and leukocytosis.  Patient was admitted and underwent procedure listed above.  Tolerated procedure well and was transferred to the floor.  The patient experienced a post-op ileus which resolved after a few days.  Diet was advanced as tolerated.  On POD #5, the patient was voiding well, tolerating diet, ambulating well, pain well controlled, vital signs stable, incisions c/d/i and felt stable for discharge home.  Patient will follow up in our office on 10/25/12 for drain removal and knows to call with questions or concerns.  Will need to take Cipro 500mg BID for 10 days and Flagyl 500mg TID for 10 days.  Physical Exam: General:  Alert, NAD, pleasant, comfortable Abd:  Soft, ND, mild tenderness, incisions C/D/I, drain with clear yellow serous drainage     Medication List    TAKE these medications       ciprofloxacin 500 MG tablet  Commonly known as:  CIPRO  Take 1 tablet (500 mg total) by mouth 2 (two) times daily.     ibuprofen 200 MG tablet  Commonly known as:  ADVIL,MOTRIN  Take 400 mg by mouth every 6 (six) hours as needed for pain.      metroNIDAZOLE 500 MG tablet  Commonly known as:  FLAGYL  Take 1 tablet (500 mg total) by mouth 3 (three) times daily.     traMADol 50 MG tablet  Commonly known as:  ULTRAM  Take 1-2 tablets (50-100 mg total) by mouth every 8 (eight) hours as needed for pain.         Follow-up Information   Follow up with THOMPSON,BURKE E, MD On 10/25/2012. (APPT AT 10/25/12 FOR DRAIN REMOVAL, ARRIVE AT 8:30 FOR CHECK IN)    Contact information:   [address] [place] [address] [phone]       Signed: Gabe Glace Dort, PA-C [facility] [phone]  10/23/2012, 8:05 AM  

## 2012-10-25 ENCOUNTER — Encounter (INDEPENDENT_AMBULATORY_CARE_PROVIDER_SITE_OTHER): Payer: Self-pay | Admitting: General Surgery

## 2012-10-25 ENCOUNTER — Ambulatory Visit (INDEPENDENT_AMBULATORY_CARE_PROVIDER_SITE_OTHER): Payer: Commercial Managed Care - PPO | Admitting: General Surgery

## 2012-10-25 VITALS — BP 126/74 | HR 70 | Temp 97.4°F | Resp 14 | Ht 72.0 in | Wt 162.2 lb

## 2012-10-25 DIAGNOSIS — K3533 Acute appendicitis with perforation and localized peritonitis, with abscess: Secondary | ICD-10-CM

## 2012-10-25 DIAGNOSIS — Z9889 Other specified postprocedural states: Secondary | ICD-10-CM

## 2012-10-25 DIAGNOSIS — Z9049 Acquired absence of other specified parts of digestive tract: Secondary | ICD-10-CM

## 2012-10-25 NOTE — Progress Notes (Signed)
Subjective:     Patient ID: [name], male   DOB: January 01, 1966, 47 y.o.   MRN: [id number]  HPI Patient with laparoscopic appendectomy for perforated appendicitis with early abscess formation last week. He is been doing well at home. He a JP drain left. That is tapered with output being only 5 cc the past 2 days. He is eating. Pharynx a mild constipation. No fevers.  Review of Systems     Objective:   Physical Exam Abdomen soft and nontender. 2 incisions are clean dry and intact. JP drain has serous fluid about 2 cc only. JP was removed without difficulty and a dressing was placed.No abdominal distention or generalized tenderness.    Assessment:     Doing very well status post arthroscopic appendectomy for perforated appendicitis with small abscess    Plan:     Drain was removed as above. He is completing his oral antibiotics. I will see him back in 4 weeks. I recommended docusate and MiraLAX for bowel regimen.

## 2012-10-31 ENCOUNTER — Telehealth (INDEPENDENT_AMBULATORY_CARE_PROVIDER_SITE_OTHER): Payer: Self-pay | Admitting: General Surgery

## 2012-10-31 NOTE — Telephone Encounter (Signed)
Pt called to report he has completed 8 days of his 10-day antibiotics.  He is going to stop them, secondary to severe GI upset.  Pt denies any fever or symptoms of infection, including swelling, redness, odor or drainage from surgical wounds.  FYI.

## 2012-11-06 ENCOUNTER — Telehealth (INDEPENDENT_AMBULATORY_CARE_PROVIDER_SITE_OTHER): Payer: Self-pay | Admitting: *Deleted

## 2012-11-06 NOTE — Telephone Encounter (Signed)
Patient called to state that he isn't feeling quite right.  Patient states fatigue and some abdominal discomfort.  Patient denies any fevers, redness at incision site or drainage.  Encouraged patient to try to eat high protein and high iron diet. Patient encouraged that while is is exercising to listen to his body and not push it.  Patient states understanding to all.  Patient is asking about having blood work drawn to make sure nothing is wrong.  Stated to patient that at this time it sounds like part of his recovery.  Patient will call back Wednesday if he is still having issues to possibly have blood work drawn at that time if the symptoms persist.

## 2012-11-07 ENCOUNTER — Other Ambulatory Visit (INDEPENDENT_AMBULATORY_CARE_PROVIDER_SITE_OTHER): Payer: Self-pay | Admitting: General Surgery

## 2012-11-07 ENCOUNTER — Other Ambulatory Visit (INDEPENDENT_AMBULATORY_CARE_PROVIDER_SITE_OTHER): Payer: Self-pay

## 2012-11-07 ENCOUNTER — Ambulatory Visit
Admission: RE | Admit: 2012-11-07 | Discharge: 2012-11-07 | Disposition: A | Discharge status: Home / Self Care | Payer: Commercial Managed Care - PPO | Source: Ambulatory Visit | Attending: General Surgery | Admitting: General Surgery

## 2012-11-07 ENCOUNTER — Encounter (INDEPENDENT_AMBULATORY_CARE_PROVIDER_SITE_OTHER): Payer: Self-pay | Admitting: General Surgery

## 2012-11-07 ENCOUNTER — Telehealth (INDEPENDENT_AMBULATORY_CARE_PROVIDER_SITE_OTHER): Payer: Self-pay | Admitting: *Deleted

## 2012-11-07 ENCOUNTER — Telehealth (INDEPENDENT_AMBULATORY_CARE_PROVIDER_SITE_OTHER): Payer: Self-pay

## 2012-11-07 DIAGNOSIS — K3532 Acute appendicitis with perforation and localized peritonitis, without abscess: Secondary | ICD-10-CM

## 2012-11-07 DIAGNOSIS — D72829 Elevated white blood cell count, unspecified: Secondary | ICD-10-CM

## 2012-11-07 LAB — CBC WITH DIFFERENTIAL/PLATELET
Lymphocytes Relative: 11 % — ABNORMAL LOW (ref 12–46)
MCHC: 33.4 g/dL (ref 30.0–36.0)
Neutro Abs: 12.5 10*3/uL — ABNORMAL HIGH (ref 1.7–7.7)
Platelets: 350 10*3/uL (ref 150–400)
RDW: 12.6 % (ref 11.5–15.5)
WBC: 14.9 10*3/uL — ABNORMAL HIGH (ref 4.0–10.5)

## 2012-11-07 MED ORDER — AMOXICILLIN-POT CLAVULANATE 875-125 MG PO TABS: 1.0000 | ORAL_TABLET | Freq: Two times a day (BID) | ORAL | Status: AC

## 2012-11-07 MED ORDER — IOHEXOL 300 MG/ML  SOLN
75.0000 mL | Freq: Once | INTRAMUSCULAR | Status: AC | PRN
  Administered 2012-11-07: 75 mL via INTRAVENOUS

## 2012-11-07 NOTE — Progress Notes (Signed)
Patient ID: [name], male   DOB: 1965/08/06, 47 y.o.   MRN: [id number] His CT scan demonstrates a RLQ inflammatory phlegmon but no obvious abscess.  This likely explains his discomfort, low grade fever, and leukocytosis.  Will start him on Augmentin and have Dr. Janee Morn review the CT as well.  He has an appointment on 11/29/12 but may need to be seen sooner.

## 2012-11-07 NOTE — Telephone Encounter (Signed)
Pt scheduled for CT abd/pelvis w/ contrast at [facility] today.  Will relay results to Dr. Janee Morn.

## 2012-11-07 NOTE — Telephone Encounter (Signed)
Close encounter 

## 2012-11-07 NOTE — Telephone Encounter (Signed)
Pt calling still very concerned about his pain and discomfort.  He now states he is running a low grade temperature of 99.3.  He is very worried about infection, and requests lab work.  Dr. Janee Morn was paged and ordered a cbc w/diff.  Patient was given instructions to go to [facility].  We will call him with these results.

## 2012-11-07 NOTE — Telephone Encounter (Signed)
Patient instructed to go to get CT Abd/Pelvis at this time due to elevated WBC per Janee Morn MD.  Patient states understanding and agreeable at this time.

## 2012-11-09 ENCOUNTER — Telehealth (INDEPENDENT_AMBULATORY_CARE_PROVIDER_SITE_OTHER): Payer: Self-pay

## 2012-11-09 ENCOUNTER — Telehealth: Payer: Self-pay | Admitting: General Surgery

## 2012-11-09 NOTE — Telephone Encounter (Signed)
Called patient to see how he was doing, left message on machine. We'll have nursing staff contact him to move up his appointment.

## 2012-11-09 NOTE — Telephone Encounter (Signed)
Patient called back. I gave him new appt info.

## 2012-11-09 NOTE — Telephone Encounter (Signed)
I called and left the pt a message to call me.  Dr Janee Morn wants his appointment moved up to April 23rd.  I did that and need to let him know.

## 2012-11-13 ENCOUNTER — Telehealth (INDEPENDENT_AMBULATORY_CARE_PROVIDER_SITE_OTHER): Payer: Self-pay | Admitting: General Surgery

## 2012-11-13 ENCOUNTER — Other Ambulatory Visit (INDEPENDENT_AMBULATORY_CARE_PROVIDER_SITE_OTHER): Payer: Self-pay | Admitting: General Surgery

## 2012-11-13 DIAGNOSIS — R509 Fever, unspecified: Secondary | ICD-10-CM

## 2012-11-13 LAB — CBC WITH DIFFERENTIAL/PLATELET
HCT: 40.5 % (ref 39.0–52.0)
Hemoglobin: 13.2 g/dL (ref 13.0–17.0)
MCHC: 32.5 g/dL (ref 30.0–36.0)
Monocytes Absolute: 0.2 10*3/uL (ref 0.1–1.0)
Monocytes Relative: 2 % — ABNORMAL LOW (ref 3–12)
Neutro Abs: 12.2 10*3/uL — ABNORMAL HIGH (ref 1.7–7.7)

## 2012-11-13 NOTE — Telephone Encounter (Signed)
Pt called in for CBC results: informed they are "essentially the same as they were on 11/07/12."  Recommended he push po fluids, continue antibiotics and antipyretics and keep appt on Wednesday as scheduled.  He understands.

## 2012-11-13 NOTE — Telephone Encounter (Signed)
Pt called to report he has been running fever up to 101F for the last two nights; asked if we will order lab work.  He has appt this week for recheck in office.  Paged and updated Dr. Janee Morn.  Ordered CBC with diff (STAT, per pt request) and notified pt to go to [facility].  He understands and will go now.

## 2012-11-15 ENCOUNTER — Encounter (INDEPENDENT_AMBULATORY_CARE_PROVIDER_SITE_OTHER): Payer: Self-pay | Admitting: General Surgery

## 2012-11-15 ENCOUNTER — Ambulatory Visit (INDEPENDENT_AMBULATORY_CARE_PROVIDER_SITE_OTHER): Payer: Commercial Managed Care - PPO | Admitting: General Surgery

## 2012-11-15 VITALS — BP 120/72 | HR 60 | Resp 16 | Ht 72.0 in | Wt 158.0 lb

## 2012-11-15 DIAGNOSIS — N200 Calculus of kidney: Secondary | ICD-10-CM

## 2012-11-15 NOTE — Progress Notes (Signed)
Subjective:     Patient ID: [name], male   DOB: 1966-06-15, 47 y.o.   MRN: [id number]  HPI Patient presents status post laparoscopic appendectomy for perforated appendicitis with abscess. After discharge, he developed malaise and low-grade fever. CT scan demonstrated cecal phlegmon with no evidence of abscess. He has been on Augmentin. He felt better 2 days ago. He is back to work although still quite tired. Bowel function is fine. Fevers have resolved. Abdominal pain is better.  Review of Systems     Objective:   Physical Exam Abdomen is soft and nontender. All 3 incisions are well-healed without signs of infection. There is no palpable mass.    Assessment:     Cecal phlegmon status post laparoscopic appendectomy for perforated appendicitis with abscess. Improving on Augmentin.    Plan:     Complete course of Augmentin. Weight lifting restrictions postoperatively for 4 weeks. Return when necessary. In addition, the patient's CAT scan demonstrated some kidney stones. He is interested in further evaluation of this. I will arrange a urology consultation as an outpatient.

## 2012-11-28 ENCOUNTER — Telehealth (INDEPENDENT_AMBULATORY_CARE_PROVIDER_SITE_OTHER): Payer: Self-pay

## 2012-11-28 NOTE — Telephone Encounter (Signed)
Called pt and he did not answer.  Left message for pt to call.

## 2012-11-28 NOTE — Telephone Encounter (Signed)
Message copied by Ivory Broad on Tue Nov 28, 2012  5:27 PM ------      Message from: Marnette Burgess      Created: Tue Nov 28, 2012  4:43 PM      Contact: [phone]       Patient had sx about 4wks ago, has some questions, non-urgent, please call. ------

## 2012-11-29 ENCOUNTER — Encounter (INDEPENDENT_AMBULATORY_CARE_PROVIDER_SITE_OTHER): Payer: Commercial Managed Care - PPO | Admitting: General Surgery

## 2013-03-05 ENCOUNTER — Telehealth: Payer: Self-pay | Admitting: Family Medicine

## 2013-03-05 NOTE — Telephone Encounter (Signed)
Dr. Quinn Axe [facility]) was your patient. Last seen Feb 2010. Would like to get back in with you and have a cpx (Sept/Oct). Is it ok for him to re-establish? Thank you.

## 2013-03-11 NOTE — Telephone Encounter (Signed)
Yes I can see him again thanks

## 2013-03-12 NOTE — Telephone Encounter (Signed)
Called pt & LMOM to call me back and schedule.

## 2013-03-12 NOTE — Telephone Encounter (Signed)
Appts made. Pt aware.

## 2013-04-13 ENCOUNTER — Other Ambulatory Visit (INDEPENDENT_AMBULATORY_CARE_PROVIDER_SITE_OTHER): Payer: Commercial Managed Care - PPO

## 2013-04-13 DIAGNOSIS — Z0000 Encounter for general adult medical examination without abnormal findings: Secondary | ICD-10-CM

## 2013-04-13 LAB — CBC WITH DIFFERENTIAL/PLATELET
Basophils Absolute: 0 10*3/uL (ref 0.0–0.1)
Eosinophils Absolute: 0.2 10*3/uL (ref 0.0–0.7)
Lymphocytes Relative: 33.3 % (ref 12.0–46.0)
MCHC: 34 g/dL (ref 30.0–36.0)
Neutrophils Relative %: 50.9 % (ref 43.0–77.0)
RBC: 5.08 Mil/uL (ref 4.22–5.81)
RDW: 13.6 % (ref 11.5–14.6)

## 2013-04-13 LAB — BASIC METABOLIC PANEL
Chloride: 104 mEq/L (ref 96–112)
Potassium: 4.4 mEq/L (ref 3.5–5.1)

## 2013-04-13 LAB — TSH: TSH: 0.64 u[IU]/mL (ref 0.35–5.50)

## 2013-04-13 LAB — LIPID PANEL
Cholesterol: 189 mg/dL (ref 0–200)
HDL: 50.2 mg/dL
LDL Cholesterol: 122 mg/dL — ABNORMAL HIGH (ref 0–99)
Total CHOL/HDL Ratio: 4
Triglycerides: 85 mg/dL (ref 0.0–149.0)
VLDL: 17 mg/dL (ref 0.0–40.0)

## 2013-04-13 LAB — HEPATIC FUNCTION PANEL
ALT: 19 U/L (ref 0–53)
AST: 18 U/L (ref 0–37)
Alkaline Phosphatase: 58 U/L (ref 39–117)
Bilirubin, Direct: 0.1 mg/dL (ref 0.0–0.3)
Total Bilirubin: 1.1 mg/dL (ref 0.3–1.2)

## 2013-04-13 LAB — POCT URINALYSIS DIPSTICK
Bilirubin, UA: NEGATIVE
Glucose, UA: NEGATIVE
Ketones, UA: NEGATIVE
Spec Grav, UA: 1.02
Urobilinogen, UA: 0.2

## 2013-04-17 NOTE — Progress Notes (Signed)
Quick Note:  Pt has appointment on 04/25/13 will go over then. ______

## 2013-04-25 ENCOUNTER — Encounter: Payer: Self-pay | Admitting: Family Medicine

## 2013-04-25 ENCOUNTER — Ambulatory Visit (INDEPENDENT_AMBULATORY_CARE_PROVIDER_SITE_OTHER): Payer: Commercial Managed Care - PPO | Admitting: Family Medicine

## 2013-04-25 VITALS — BP 124/70 | HR 83 | Temp 98.1°F | Ht 71.5 in | Wt 168.0 lb

## 2013-04-25 DIAGNOSIS — Z0000 Encounter for general adult medical examination without abnormal findings: Secondary | ICD-10-CM

## 2013-04-25 DIAGNOSIS — R82994 Hypercalciuria: Secondary | ICD-10-CM

## 2013-04-25 NOTE — Progress Notes (Signed)
  Subjective:    Patient ID: [name], male    DOB: 1965/11/21, 47 y.o.   MRN: [id number]  HPI 47 yr old male for a cpx. He feels fine and has no complaints. Of note this summer he was diagnosed with kidney stones and he saw Dr. Patsi Sears. His urine was positive for high levels of calcium even though his serum calcium level is normal. He was told to see what we think about this. He drinks plenty of water.    Review of Systems  Constitutional: Negative.   HENT: Negative.   Eyes: Negative.   Respiratory: Negative.   Cardiovascular: Negative.   Gastrointestinal: Negative.   Genitourinary: Negative.   Musculoskeletal: Negative.   Skin: Negative.   Neurological: Negative.   Psychiatric/Behavioral: Negative.        Objective:   Physical Exam  Constitutional: He is oriented to person, place, and time. He appears well-developed and well-nourished. No distress.  HENT:  Head: Normocephalic and atraumatic.  Right Ear: External ear normal.  Left Ear: External ear normal.  Nose: Nose normal.  Mouth/Throat: Oropharynx is clear and moist. No oropharyngeal exudate.  Eyes: Conjunctivae and EOM are normal. Pupils are equal, round, and reactive to light. Right eye exhibits no discharge. Left eye exhibits no discharge. No scleral icterus.  Neck: Neck supple. No JVD present. No tracheal deviation present. No thyromegaly present.  Cardiovascular: Normal rate, regular rhythm, normal heart sounds and intact distal pulses.  Exam reveals no gallop and no friction rub.   No murmur heard. Pulmonary/Chest: Effort normal and breath sounds normal. No respiratory distress. He has no wheezes. He has no rales. He exhibits no tenderness.  Abdominal: Soft. Bowel sounds are normal. He exhibits no distension and no mass. There is no tenderness. There is no rebound and no guarding.  Genitourinary: Rectum normal, prostate normal and penis normal. Guaiac negative stool. No penile tenderness.  Musculoskeletal:  Normal range of motion. He exhibits no edema and no tenderness.  Lymphadenopathy:    He has no cervical adenopathy.  Neurological: He is alert and oriented to person, place, and time. He has normal reflexes. No cranial nerve deficit. He exhibits normal muscle tone. Coordination normal.  Skin: Skin is warm and dry. No rash noted. He is not diaphoretic. No erythema. No pallor.  Psychiatric: He has a normal mood and affect. His behavior is normal. Judgment and thought content normal.          Assessment & Plan:  Well exam. We are concerned about the possibility of osteoporosis given his hx of hypercalcuria, so we will screen him with a DEXA soon. He is avoiding calcium in his diet.

## 2013-04-26 ENCOUNTER — Encounter: Payer: Self-pay | Admitting: Family Medicine

## 2013-05-02 ENCOUNTER — Ambulatory Visit (INDEPENDENT_AMBULATORY_CARE_PROVIDER_SITE_OTHER)
Admission: RE | Admit: 2013-05-02 | Discharge: 2013-05-02 | Disposition: A | Discharge status: Home / Self Care | Payer: Commercial Managed Care - PPO | Source: Ambulatory Visit | Attending: Family Medicine | Admitting: Family Medicine

## 2013-05-02 DIAGNOSIS — R82994 Hypercalciuria: Secondary | ICD-10-CM

## 2013-05-04 ENCOUNTER — Telehealth: Payer: Self-pay | Admitting: Family Medicine

## 2013-05-04 MED ORDER — ALENDRONATE SODIUM 35 MG PO TABS: 35.0000 mg | ORAL_TABLET | ORAL | Status: DC

## 2013-05-04 NOTE — Telephone Encounter (Signed)
Message copied by Baldemar Friday on Fri May 04, 2013  2:52 PM ------      Message from: Gershon Crane A      Created: Thu May 03, 2013  1:03 PM       He has osteopenia, which is thinning of the bones but not to the point of osteoporosis. I definitely feel this is tied to the high levels of calcium in his urine. Start on Fosamax to slow down this process. Call in Fosamax 35 mg weekly for one year. We will repeat a DEXA in 2 years ------

## 2013-05-04 NOTE — Telephone Encounter (Signed)
I spoke with pt and sent script e-scribe. 

## 2013-05-04 NOTE — Telephone Encounter (Signed)
Austan/Patient phone [phone] called to ask regarding Aledronate, can he take Vit D instead?  Number cannot be completed as dialed.  Verified correct number in Epic and dialed caller.  Triage form sent per Fax for follow up when office is open per Adult Medication Question.

## 2013-05-04 NOTE — Progress Notes (Signed)
Quick Note:  I left a voice message for pt to return my call. ______ 

## 2013-05-04 NOTE — Progress Notes (Signed)
Quick Note:  I sent script e-scribe to [facility], per pt request and I did go over results with pt. ______

## 2013-05-07 ENCOUNTER — Telehealth: Payer: Self-pay | Admitting: Family Medicine

## 2013-05-07 DIAGNOSIS — R82994 Hypercalciuria: Secondary | ICD-10-CM

## 2013-05-07 NOTE — Telephone Encounter (Signed)
That's fine. I know he is concerned about the Alendronate. Cancel that and tell him to take vitamin D 1000 units daily.

## 2013-05-07 NOTE — Telephone Encounter (Signed)
I ordered the test. Have him call the lab tomorrow to arrange to pick up the materials

## 2013-05-07 NOTE — Telephone Encounter (Signed)
I left voice message with the below information. 

## 2013-05-07 NOTE — Telephone Encounter (Signed)
Pt is calling to inquire about a 24 hour test for his calcium in his urine. He states that he had one of these done with a endocrinologist, but would like to repeat one through [place]. Pt would like to speak with Dr. Clent Ridges. Please assist.

## 2013-05-08 ENCOUNTER — Other Ambulatory Visit: Payer: Commercial Managed Care - PPO

## 2013-05-08 DIAGNOSIS — R82994 Hypercalciuria: Secondary | ICD-10-CM

## 2013-05-08 NOTE — Telephone Encounter (Signed)
Pt is aware and will be picking up the materials today or tomorrow. He states that he would like to discuss the details of his endocrinology visit w/ Dr. Clent Ridges.

## 2013-05-17 LAB — CALCIUM, URINE, 24 HOUR: Calcium, Ur: 15 mg/dL

## 2013-05-21 ENCOUNTER — Encounter: Payer: Self-pay | Admitting: Family Medicine

## 2013-05-21 NOTE — Progress Notes (Signed)
Quick Note:  I left voice message with results and released them in my chart. ______

## 2013-10-22 IMAGING — CT CT ABD-PELV W/ CM
2 of 5 series · 16 of 46 positions shown, 18 images · IV contrast (30CC OMNI 300 & 75CC OMNI 300)
Comparison: preoperative study 10/17/2012.

CLINICAL DATA: 46-year-old male status post appendectomy on
10/18/2012.  Pain and elevated white blood cell count.  Low grade
fever.

CT ABDOMEN AND PELVIS WITH CONTRAST
TECHNIQUE: Multidetector CT imaging of the abdomen and pelvis was
performed following the standard protocol during bolus
administration of intravenous contrast.
Contrast: 75mL OMNIPAQUE IOHEXOL 300 MG/ML  SOLN

[Series 2: abd/pelvis with · axial · 0.78mm/px · z∈[-457,-27]mm · 13 of 98 slices shown, 15 images]
[im 6/98  soft-tissue]
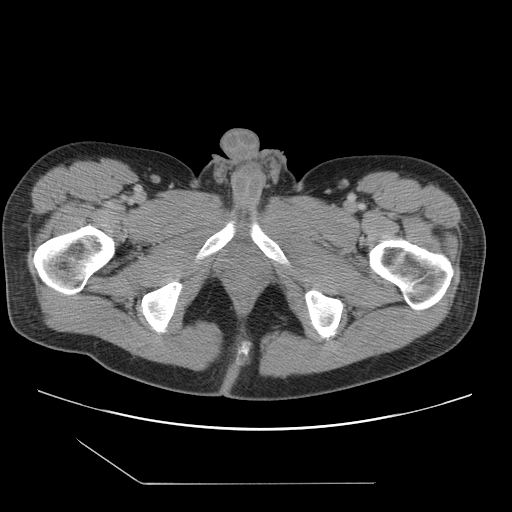
[im 6/98  bone]
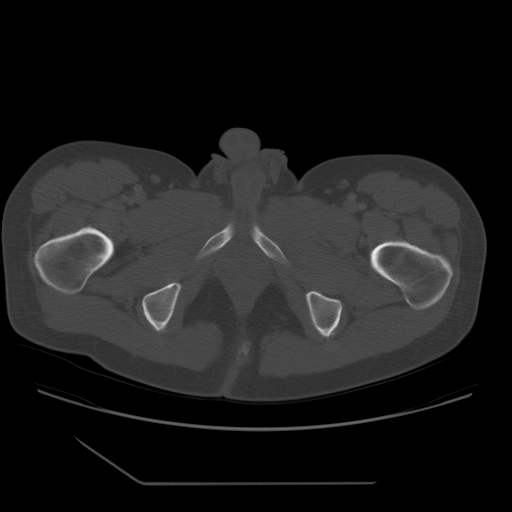
[im 16/98  soft-tissue]
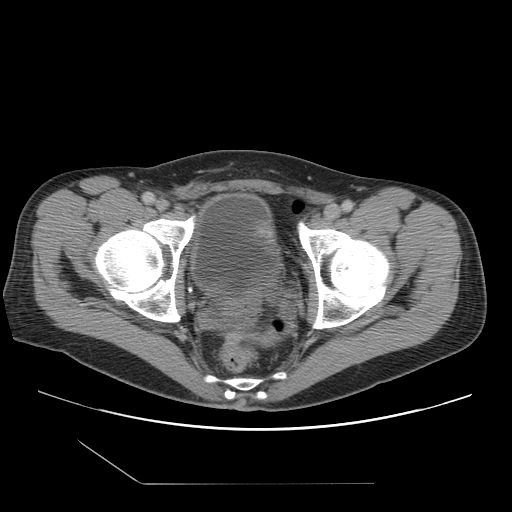
[im 21/98  soft-tissue]
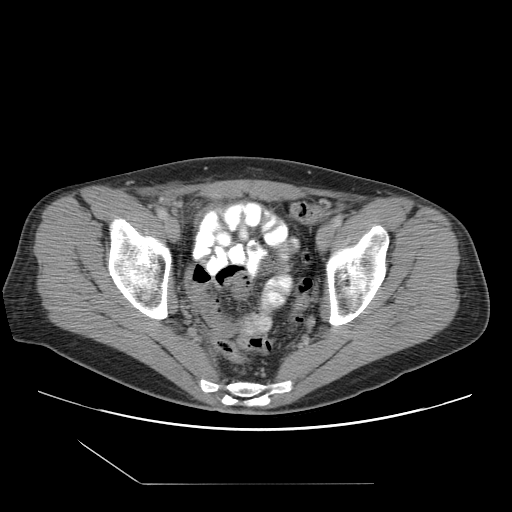
[im 26/98  soft-tissue]
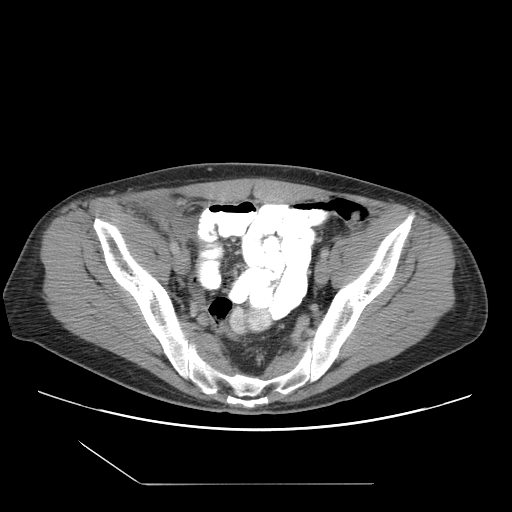
[im 36/98  soft-tissue]
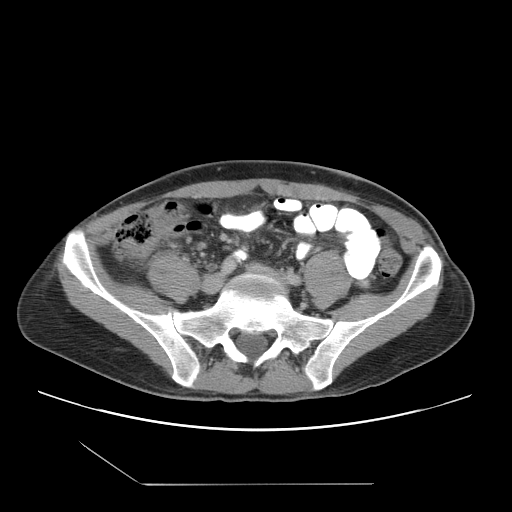
[im 41/98  soft-tissue]
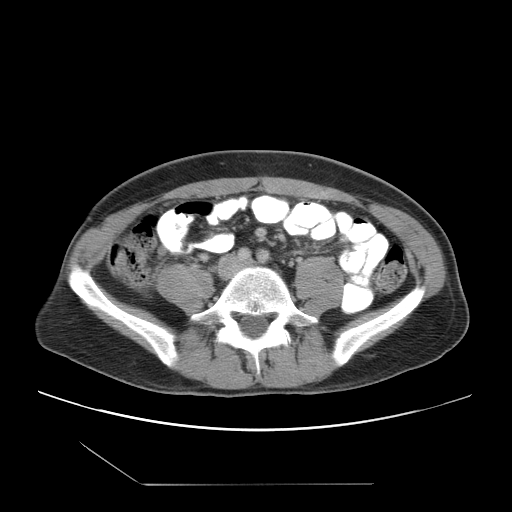
[im 52/98  soft-tissue]
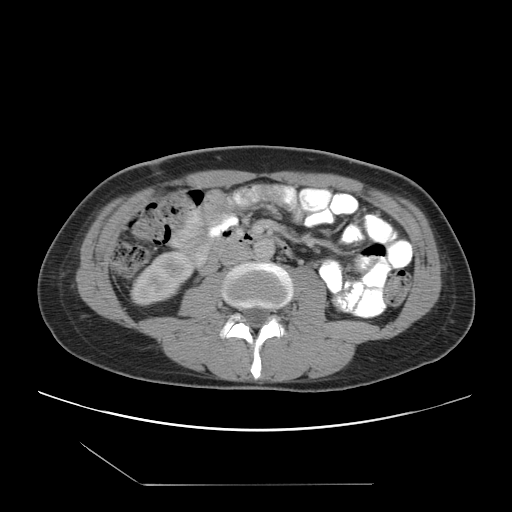
[im 57/98  soft-tissue]
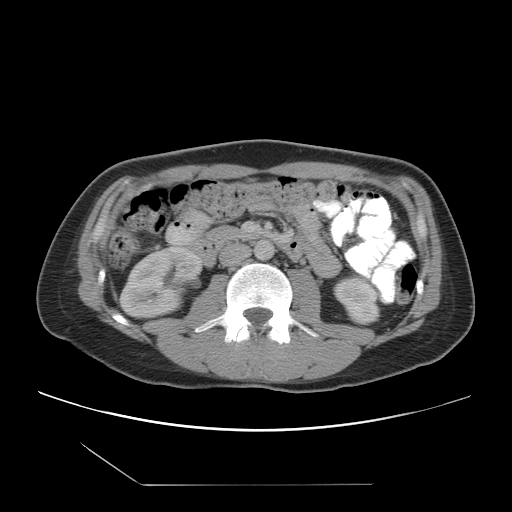
[im 62/98  soft-tissue]
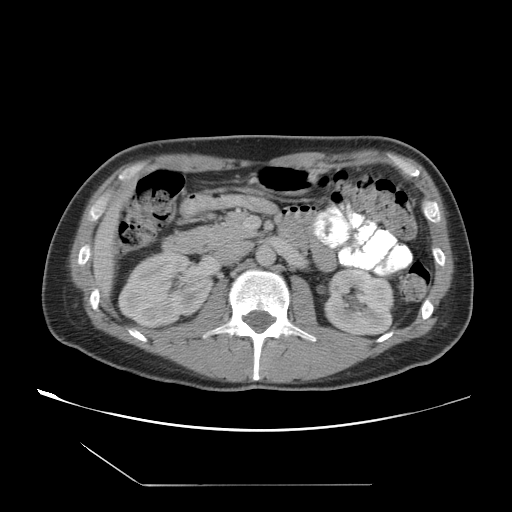
[im 62/98  bone]
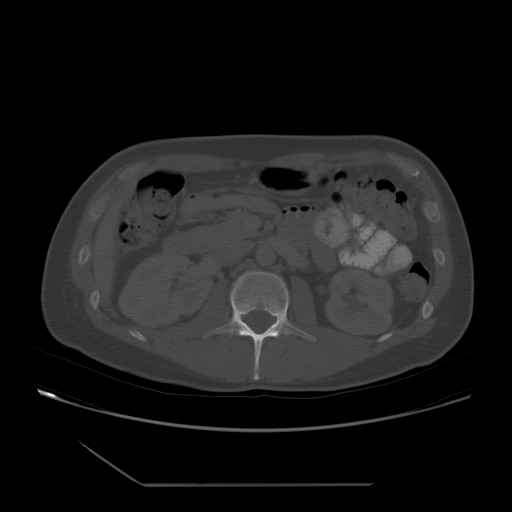
[im 72/98  soft-tissue]
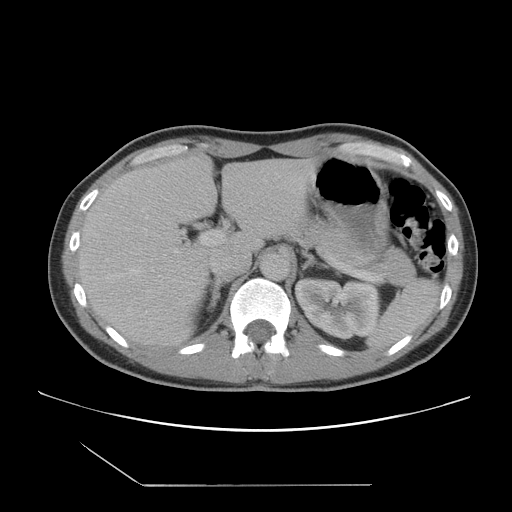
[im 77/98  soft-tissue]
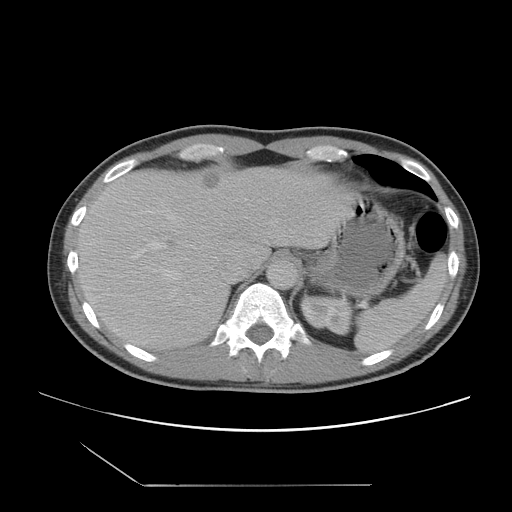
[im 82/98  soft-tissue]
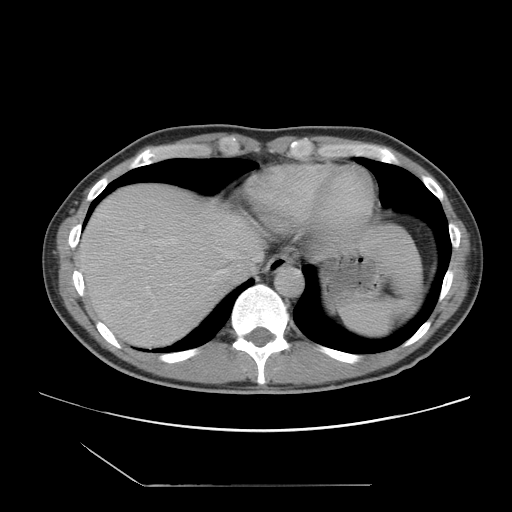
[im 92/98  soft-tissue]
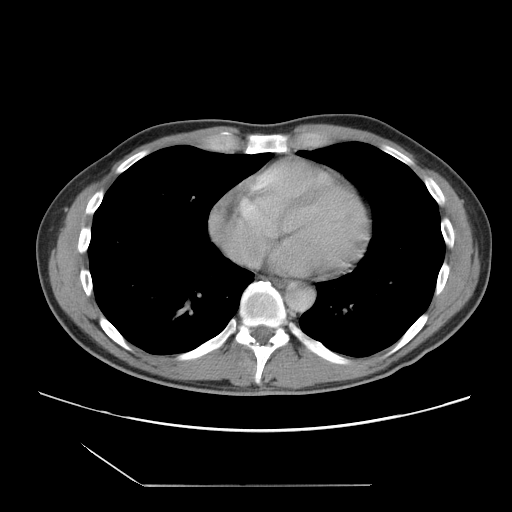

[Series 401: coronal · coronal · 1.02mm/px · 3 of 96 slices shown]
[im 32/96  soft-tissue]
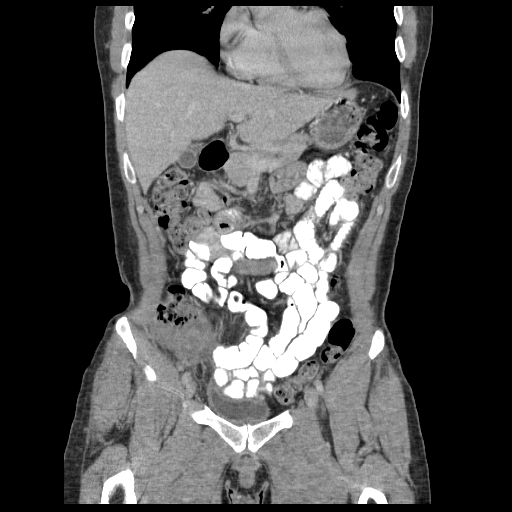
[im 43/96  soft-tissue]
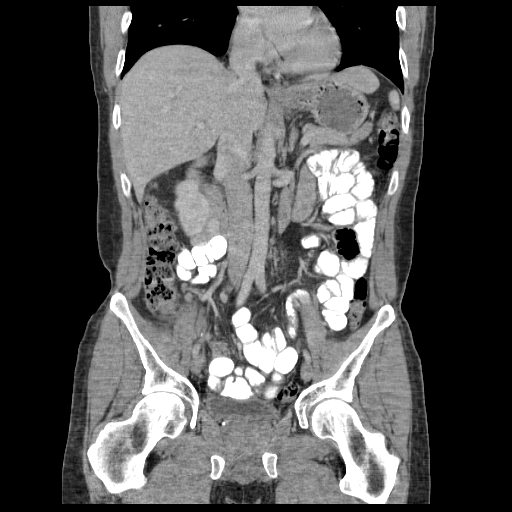
[im 53/96  soft-tissue]
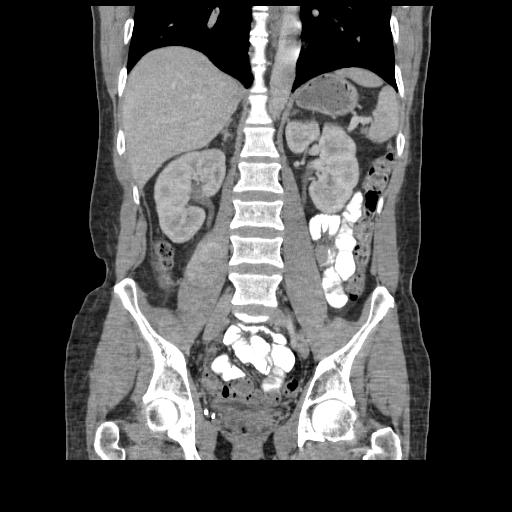

[16 of 46 positions shown; findings below may reference images not displayed]

FINDINGS: Decreased atelectasis at the lung bases.  No pericardial
or pleural effusion.

Stable visualized osseous structures.

No pelvic free fluid.  Negative rectum and sigmoid, mildly
redundant.  Negative left colon.  Retained stool in the transverse
colon.  Hepatic flexure within normal limits.

Much of the ascending colon is normal.  There is indistinctness of
the cecum from the level of the IC valve to the cecal tip.  There
is mild secondary inflammation of the terminal ileum.  There is a
there is associated right lower quadrant phlegmon (series 2 image
69).  The majority of this is intermediate density (52 HU).  There
is a focal low density area along the inferior medial aspect of the
inflammation measuring 13 x 17 mm (series 2 image 70), but
densitometry suggest the is not drainable fluid.  Suture material
seen nearby.

Mildly decreased right lower quadrant mesenteric lymphadenopathy.
Oral contrast has not yet reached the distal small bowel.  No
extraluminal gas.  No dilated small bowel.

Negative stomach, duodenum, liver (unchanged anterior sub capsular
low density area probably a benign cyst  / ciliated foregut
duplication cyst), gallbladder, spleen, pancreas, and adrenal
glands.  Portal venous system remains patent.  Stable kidneys with
lobulation and left greater than right nephrolithiasis.  No
hydronephrosis or perinephric stranding. No abdominal free fluid.
Major arterial structures appear remain patent.  Unremarkable
bladder.  Stable pelvic phleboliths.
IMPRESSION: 1.  Right lower quadrant phlegmon at the site of the previous
appendicitis.  No convincing areas of drainable fluid.  Associated
inflammation of the cecum and mild secondary inflammation of the
distal ileum.  No evidence of associate bowel obstruction.
2.  Otherwise stable abdomen and pelvis.

## 2015-07-31 ENCOUNTER — Other Ambulatory Visit: Payer: Commercial Managed Care - PPO

## 2015-08-04 ENCOUNTER — Encounter: Payer: Commercial Managed Care - PPO | Admitting: Family Medicine

## 2015-08-15 ENCOUNTER — Other Ambulatory Visit (INDEPENDENT_AMBULATORY_CARE_PROVIDER_SITE_OTHER): Payer: Commercial Managed Care - PPO

## 2015-08-15 DIAGNOSIS — Z0000 Encounter for general adult medical examination without abnormal findings: Secondary | ICD-10-CM | POA: Diagnosis not present

## 2015-08-15 LAB — POCT URINALYSIS DIPSTICK
BILIRUBIN UA: NEGATIVE
GLUCOSE UA: NEGATIVE
Ketones, UA: NEGATIVE
Leukocytes, UA: NEGATIVE
NITRITE UA: NEGATIVE
PH UA: 6
Protein, UA: NEGATIVE
RBC UA: NEGATIVE
Spec Grav, UA: 1.025
UROBILINOGEN UA: 0.2

## 2015-08-15 LAB — CBC WITH DIFFERENTIAL/PLATELET
BASOS ABS: 0 10*3/uL (ref 0.0–0.1)
Basophils Relative: 0.7 % (ref 0.0–3.0)
EOS PCT: 6.4 % — AB (ref 0.0–5.0)
Eosinophils Absolute: 0.4 10*3/uL (ref 0.0–0.7)
HCT: 47.7 % (ref 39.0–52.0)
HEMOGLOBIN: 15.8 g/dL (ref 13.0–17.0)
LYMPHS PCT: 31.6 % (ref 12.0–46.0)
Lymphs Abs: 2.1 10*3/uL (ref 0.7–4.0)
MCHC: 33.1 g/dL (ref 30.0–36.0)
MCV: 91.3 fl (ref 78.0–100.0)
MONOS PCT: 13.1 % — AB (ref 3.0–12.0)
Monocytes Absolute: 0.9 10*3/uL (ref 0.1–1.0)
NEUTROS PCT: 48.2 % (ref 43.0–77.0)
Neutro Abs: 3.2 10*3/uL (ref 1.4–7.7)
Platelets: 245 10*3/uL (ref 150.0–400.0)
RBC: 5.23 Mil/uL (ref 4.22–5.81)
RDW: 13 % (ref 11.5–15.5)
WBC: 6.7 10*3/uL (ref 4.0–10.5)

## 2015-08-15 LAB — HEPATIC FUNCTION PANEL
ALBUMIN: 4.5 g/dL (ref 3.5–5.2)
ALK PHOS: 72 U/L (ref 39–117)
ALT: 18 U/L (ref 0–53)
AST: 18 U/L (ref 0–37)
Bilirubin, Direct: 0.1 mg/dL (ref 0.0–0.3)
Total Bilirubin: 0.6 mg/dL (ref 0.2–1.2)
Total Protein: 7.3 g/dL (ref 6.0–8.3)

## 2015-08-15 LAB — BASIC METABOLIC PANEL
BUN: 15 mg/dL (ref 6–23)
CHLORIDE: 103 meq/L (ref 96–112)
CO2: 30 meq/L (ref 19–32)
CREATININE: 1.04 mg/dL (ref 0.40–1.50)
Calcium: 9.7 mg/dL (ref 8.4–10.5)
GFR: 80.25 mL/min (ref >60.00)
GLUCOSE: 96 mg/dL (ref 70–99)
Potassium: 4.8 mEq/L (ref 3.5–5.1)
Sodium: 140 mEq/L (ref 135–145)

## 2015-08-15 LAB — LIPID PANEL
CHOLESTEROL: 197 mg/dL (ref 0–200)
HDL: 46 mg/dL (ref >39.00)
LDL Cholesterol: 138 mg/dL — ABNORMAL HIGH (ref 0–99)
NonHDL: 151.21
TRIGLYCERIDES: 66 mg/dL (ref 0.0–149.0)
Total CHOL/HDL Ratio: 4
VLDL: 13.2 mg/dL (ref 0.0–40.0)

## 2015-08-15 LAB — TSH: TSH: 1.26 u[IU]/mL (ref 0.35–4.50)

## 2015-08-15 LAB — PSA: PSA: 2.86 ng/mL (ref 0.10–4.00)

## 2015-08-21 ENCOUNTER — Ambulatory Visit (INDEPENDENT_AMBULATORY_CARE_PROVIDER_SITE_OTHER): Payer: Commercial Managed Care - PPO | Admitting: Family Medicine

## 2015-08-21 ENCOUNTER — Encounter: Payer: Self-pay | Admitting: Family Medicine

## 2015-08-21 VITALS — BP 118/69 | HR 72 | Temp 98.2°F | Ht 71.5 in | Wt 176.0 lb

## 2015-08-21 DIAGNOSIS — Z0000 Encounter for general adult medical examination without abnormal findings: Secondary | ICD-10-CM | POA: Diagnosis not present

## 2015-08-21 DIAGNOSIS — Z23 Encounter for immunization: Secondary | ICD-10-CM

## 2015-08-21 DIAGNOSIS — M8580 Other specified disorders of bone density and structure, unspecified site: Secondary | ICD-10-CM | POA: Diagnosis not present

## 2015-08-21 NOTE — Progress Notes (Signed)
Pre visit review using our clinic review tool, if applicable. No additional management support is needed unless otherwise documented below in the visit note. 

## 2015-08-21 NOTE — Progress Notes (Signed)
   Subjective:    Patient ID: [name], male    DOB: 1966/03/25, 50 y.o.   MRN: [id number]  HPI 50 yr old male for a cpx. He feels well. He has a hx of osteopenia and his last DEXA was in 2014. He took Fosamax for a while a few years ago but he has since stopped it. 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    Review of Systems  Constitutional: Negative.   HENT: Negative.   Eyes: Negative.   Respiratory: Negative.   Cardiovascular: Negative.   Gastrointestinal: Negative.   Genitourinary: Negative.   Musculoskeletal: Negative.   Skin: Negative.   Neurological: Negative.   Psychiatric/Behavioral: Negative.        Objective:   Physical Exam  Constitutional: He is oriented to person, place, and time. He appears well-developed and well-nourished. No distress.  HENT:  Head: Normocephalic and atraumatic.  Right Ear: External ear normal.  Left Ear: External ear normal.  Nose: Nose normal.  Mouth/Throat: Oropharynx is clear and moist. No oropharyngeal exudate.  Eyes: Conjunctivae and EOM are normal. Pupils are equal, round, and reactive to light. Right eye exhibits no discharge. Left eye exhibits no discharge. No scleral icterus.  Neck: Neck supple. No JVD present. No tracheal deviation present. No thyromegaly present.  Cardiovascular: Normal rate, regular rhythm, normal heart sounds and intact distal pulses.  Exam reveals no gallop and no friction rub.   No murmur heard. EKG normal   Pulmonary/Chest: Effort normal and breath sounds normal. No respiratory distress. He has no wheezes. He has no rales. He exhibits no tenderness.  Abdominal: Soft. Bowel sounds are normal. He exhibits no distension and no mass. There is no tenderness. There is no rebound and no guarding.  Genitourinary: Rectum normal, prostate normal and penis normal. Guaiac negative stool. No penile tenderness.  Musculoskeletal:  Normal range of motion. He exhibits no edema or tenderness.  Lymphadenopathy:    He has no cervical adenopathy.  Neurological: He is alert and oriented to person, place, and time. He has normal reflexes. No cranial nerve deficit. He exhibits normal muscle tone. Coordination normal.  Skin: Skin is warm and dry. No rash noted. He is not diaphoretic. No erythema. No pallor.  Psychiatric: He has a normal mood and affect. His behavior is normal. Judgment and thought content normal.          Assessment & Plan:  Well exam. We discussed diet and exercise. Set up his first colonoscopy. Set up another DEXA.

## 2015-08-21 NOTE — Addendum Note (Signed)
Addended by: Aniceto Boss A on: 08/21/2015 10:41 AM   Modules accepted: Orders

## 2015-08-22 ENCOUNTER — Encounter: Payer: Commercial Managed Care - PPO | Admitting: Family Medicine

## 2015-08-27 ENCOUNTER — Ambulatory Visit (INDEPENDENT_AMBULATORY_CARE_PROVIDER_SITE_OTHER)
Admission: RE | Admit: 2015-08-27 | Discharge: 2015-08-27 | Disposition: A | Discharge status: Home / Self Care | Payer: Commercial Managed Care - PPO | Source: Ambulatory Visit | Attending: Family Medicine | Admitting: Family Medicine

## 2015-08-27 DIAGNOSIS — M8580 Other specified disorders of bone density and structure, unspecified site: Secondary | ICD-10-CM

## 2016-12-22 ENCOUNTER — Encounter: Payer: Commercial Managed Care - PPO | Admitting: Family Medicine

## 2016-12-30 ENCOUNTER — Encounter: Payer: Self-pay | Admitting: Family Medicine

## 2016-12-30 ENCOUNTER — Ambulatory Visit (INDEPENDENT_AMBULATORY_CARE_PROVIDER_SITE_OTHER): Payer: Commercial Managed Care - PPO | Admitting: Family Medicine

## 2016-12-30 VITALS — BP 121/78 | HR 71 | Temp 97.7°F | Ht 71.5 in | Wt 174.0 lb

## 2016-12-30 DIAGNOSIS — Z0000 Encounter for general adult medical examination without abnormal findings: Secondary | ICD-10-CM

## 2016-12-30 LAB — CBC WITH DIFFERENTIAL/PLATELET
BASOS ABS: 0 10*3/uL (ref 0.0–0.1)
Basophils Relative: 0.6 % (ref 0.0–3.0)
EOS ABS: 0.3 10*3/uL (ref 0.0–0.7)
Eosinophils Relative: 5.4 % — ABNORMAL HIGH (ref 0.0–5.0)
HEMATOCRIT: 46 % (ref 39.0–52.0)
HEMOGLOBIN: 15.5 g/dL (ref 13.0–17.0)
LYMPHS PCT: 30.4 % (ref 12.0–46.0)
Lymphs Abs: 1.9 10*3/uL (ref 0.7–4.0)
MCHC: 33.6 g/dL (ref 30.0–36.0)
MCV: 92.8 fl (ref 78.0–100.0)
Monocytes Absolute: 0.8 10*3/uL (ref 0.1–1.0)
Monocytes Relative: 12 % (ref 3.0–12.0)
Neutro Abs: 3.2 10*3/uL (ref 1.4–7.7)
Neutrophils Relative %: 51.6 % (ref 43.0–77.0)
Platelets: 217 10*3/uL (ref 150.0–400.0)
RBC: 4.96 Mil/uL (ref 4.22–5.81)
RDW: 13.1 % (ref 11.5–15.5)
WBC: 6.3 10*3/uL (ref 4.0–10.5)

## 2016-12-30 LAB — BASIC METABOLIC PANEL
BUN: 14 mg/dL (ref 6–23)
CALCIUM: 9.7 mg/dL (ref 8.4–10.5)
CHLORIDE: 104 meq/L (ref 96–112)
CO2: 30 meq/L (ref 19–32)
Creatinine, Ser: 0.94 mg/dL (ref 0.40–1.50)
GFR: 89.69 mL/min (ref >60.00)
Glucose, Bld: 96 mg/dL (ref 70–99)
POTASSIUM: 4.4 meq/L (ref 3.5–5.1)
SODIUM: 141 meq/L (ref 135–145)

## 2016-12-30 LAB — LIPID PANEL
CHOLESTEROL: 201 mg/dL — AB (ref 0–200)
HDL: 48 mg/dL (ref >39.00)
LDL Cholesterol: 137 mg/dL — ABNORMAL HIGH (ref 0–99)
NONHDL: 153.23
Total CHOL/HDL Ratio: 4
Triglycerides: 80 mg/dL (ref 0.0–149.0)
VLDL: 16 mg/dL (ref 0.0–40.0)

## 2016-12-30 LAB — HEPATIC FUNCTION PANEL
ALT: 16 U/L (ref 0–53)
AST: 15 U/L (ref 0–37)
Albumin: 4.8 g/dL (ref 3.5–5.2)
Alkaline Phosphatase: 59 U/L (ref 39–117)
BILIRUBIN DIRECT: 0.1 mg/dL (ref 0.0–0.3)
BILIRUBIN TOTAL: 0.6 mg/dL (ref 0.2–1.2)
TOTAL PROTEIN: 7.3 g/dL (ref 6.0–8.3)

## 2016-12-30 LAB — POC URINALSYSI DIPSTICK (AUTOMATED)
Bilirubin, UA: NEGATIVE
Glucose, UA: NEGATIVE
Ketones, UA: NEGATIVE
Leukocytes, UA: NEGATIVE
NITRITE UA: NEGATIVE
PH UA: 6 (ref 5.0–8.0)
PROTEIN UA: NEGATIVE
RBC UA: NEGATIVE
Spec Grav, UA: 1.02 (ref 1.010–1.025)
UROBILINOGEN UA: 0.2 U/dL

## 2016-12-30 LAB — PSA: PSA: 3.39 ng/mL (ref 0.10–4.00)

## 2016-12-30 LAB — TSH: TSH: 1 u[IU]/mL (ref 0.35–4.50)

## 2016-12-30 NOTE — Progress Notes (Signed)
   Subjective:    Patient ID: [name], male    DOB: 03/23/1966, 51 y.o.   MRN: [id number]  HPI 51 yr old male for a well exam. He feels fine.    Review of Systems  Constitutional: Negative.   HENT: Negative.   Eyes: Negative.   Respiratory: Negative.   Cardiovascular: Negative.   Gastrointestinal: Negative.   Genitourinary: Negative.   Musculoskeletal: Negative.   Skin: Negative.   Neurological: Negative.   Psychiatric/Behavioral: Negative.        Objective:   Physical Exam  Constitutional: He is oriented to person, place, and time. He appears well-developed and well-nourished. No distress.  HENT:  Head: Normocephalic and atraumatic.  Right Ear: External ear normal.  Left Ear: External ear normal.  Nose: Nose normal.  Mouth/Throat: Oropharynx is clear and moist. No oropharyngeal exudate.  Eyes: Conjunctivae and EOM are normal. Pupils are equal, round, and reactive to light. Right eye exhibits no discharge. Left eye exhibits no discharge. No scleral icterus.  Neck: Neck supple. No JVD present. No tracheal deviation present. No thyromegaly present.  Cardiovascular: Normal rate, regular rhythm, normal heart sounds and intact distal pulses.  Exam reveals no gallop and no friction rub.   No murmur heard. Pulmonary/Chest: Effort normal and breath sounds normal. No respiratory distress. He has no wheezes. He has no rales. He exhibits no tenderness.  Abdominal: Soft. Bowel sounds are normal. He exhibits no distension and no mass. There is no tenderness. There is no rebound and no guarding.  Genitourinary: Rectum normal, prostate normal and penis normal. Rectal exam shows guaiac negative stool. No penile tenderness.  Musculoskeletal: Normal range of motion. He exhibits no edema or tenderness.  Lymphadenopathy:    He has no cervical adenopathy.  Neurological: He is alert and oriented to person, place, and time. He has normal reflexes. No cranial nerve deficit. He exhibits  normal muscle tone. Coordination normal.  Skin: Skin is warm and dry. No rash noted. He is not diaphoretic. No erythema. No pallor.  Psychiatric: He has a normal mood and affect. His behavior is normal. Judgment and thought content normal.          Assessment & Plan:  Well exam. We discussed diet and exercise. Get fasting labs.  Gershon CraneStephen Heer Justiss, MD

## 2016-12-30 NOTE — Patient Instructions (Signed)
WE NOW OFFER    [facility] FAST TRACK!!!  SAME DAY Appointments for ACUTE CARE  Such as: Sprains, Injuries, cuts, abrasions, rashes, muscle pain, joint pain, back pain Colds, flu, sore throats, headache, allergies, cough, fever  Ear pain, sinus and eye infections Abdominal pain, nausea, vomiting, diarrhea, upset stomach Animal/insect bites  3 Easy Ways to Schedule: Walk-In Scheduling Call in scheduling Mychart Sign-up: [email or web address]         

## 2016-12-31 ENCOUNTER — Telehealth: Payer: Self-pay | Admitting: Family Medicine

## 2016-12-31 NOTE — Telephone Encounter (Signed)
I spoke with pt and went over results. 

## 2016-12-31 NOTE — Telephone Encounter (Signed)
Pt would like blood work results °

## 2017-01-31 ENCOUNTER — Encounter: Payer: Self-pay | Admitting: Family Medicine

## 2017-02-01 ENCOUNTER — Encounter: Payer: Self-pay | Admitting: Gastroenterology

## 2017-03-29 ENCOUNTER — Ambulatory Visit (AMBULATORY_SURGERY_CENTER): Payer: Self-pay | Admitting: *Deleted

## 2017-03-29 VITALS — Ht 72.0 in | Wt 176.6 lb

## 2017-03-29 DIAGNOSIS — Z1211 Encounter for screening for malignant neoplasm of colon: Secondary | ICD-10-CM

## 2017-03-29 MED ORDER — NA SULFATE-K SULFATE-MG SULF 17.5-3.13-1.6 GM/177ML PO SOLN: 1.0000 | Freq: Once | ORAL | 0 refills | Status: AC

## 2017-03-29 NOTE — Progress Notes (Signed)
No egg or soy allergy known to patient  No issues with past sedation with any surgeries  or procedures, no intubation problems  No diet pills per patient No home 02 use per patient  No blood thinners per patient  Pt denies issues with constipation  No A fib or A flutter  EMMI video sent to pt's e mail -pt declined 15 $ COUPON TO PT FOR SUPREP

## 2017-04-08 ENCOUNTER — Encounter: Payer: Self-pay | Admitting: Gastroenterology

## 2017-04-14 ENCOUNTER — Encounter: Payer: Self-pay | Admitting: Family Medicine

## 2017-04-21 ENCOUNTER — Encounter: Payer: Self-pay | Admitting: Gastroenterology

## 2017-04-21 ENCOUNTER — Ambulatory Visit (AMBULATORY_SURGERY_CENTER): Payer: Commercial Managed Care - PPO | Admitting: Gastroenterology

## 2017-04-21 VITALS — BP 99/59 | HR 52 | Temp 98.6°F | Resp 14 | Ht 72.0 in | Wt 176.0 lb

## 2017-04-21 DIAGNOSIS — Z1212 Encounter for screening for malignant neoplasm of rectum: Secondary | ICD-10-CM | POA: Diagnosis not present

## 2017-04-21 DIAGNOSIS — Z1211 Encounter for screening for malignant neoplasm of colon: Secondary | ICD-10-CM

## 2017-04-21 HISTORY — PX: COLONOSCOPY: SHX174

## 2017-04-21 MED ORDER — SODIUM CHLORIDE 0.9 % IV SOLN: 500.0000 mL | INTRAVENOUS | Status: DC

## 2017-04-21 NOTE — Progress Notes (Signed)
Report to PACU, RN, vss, BBS= Clear.  

## 2017-04-21 NOTE — Op Note (Signed)
[facility] Patient Name: [name] Procedure Date: 04/21/2017 8:06 AM MRN: [id number] Endoscopist: Sherilyn Cooter L. Myrtie Neither , MD Age: 51 Referring MD:  Date of Birth: Dec 09, 1965 Gender: Male Account #: [id number] Procedure:                Colonoscopy Indications:              Screening for colorectal malignant neoplasm, This                            is the patient's first colonoscopy Medicines:                Monitored Anesthesia Care Procedure:                Pre-Anesthesia Assessment:                           - Prior to the procedure, a History and Physical                            was performed, and patient medications and                            allergies were reviewed. The patient's tolerance of                            previous anesthesia was also reviewed. The risks                            and benefits of the procedure and the sedation                            options and risks were discussed with the patient.                            All questions were answered, and informed consent                            was obtained. Prior Anticoagulants: The patient has                            taken no previous anticoagulant or antiplatelet                            agents. ASA Grade Assessment: I - A normal, healthy                            patient. After reviewing the risks and benefits,                            the patient was deemed in satisfactory condition to                            undergo the procedure.  After obtaining informed consent, the colonoscope                            was passed under direct vision. Throughout the                            procedure, the patient's blood pressure, pulse, and                            oxygen saturations were monitored continuously. The                            Colonoscope was introduced through the anus and                            advanced to the the cecum, identified by                             appendiceal orifice and ileocecal valve. The                            colonoscopy was performed without difficulty. The                            patient tolerated the procedure well. The quality                            of the bowel preparation was excellent. The                            ileocecal valve, appendiceal orifice, and rectum                            were photographed. The quality of the bowel                            preparation was evaluated using the BBPS [facility]                            Bowel Preparation Scale) with scores of: Right                            Colon = 3, Transverse Colon = 3 and Left Colon = 3                            (entire mucosa seen well with no residual staining,                            small fragments of stool or opaque liquid). The                            total BBPS score equals 9. The bowel preparation  used was SUPREP. Scope In: 8:07:32 AM Scope Out: 8:19:14 AM Scope Withdrawal Time: 0 hours 8 minutes 8 seconds  Total Procedure Duration: 0 hours 11 minutes 42 seconds  Findings:                 The entire examined colon appeared normal on direct                            and retroflexion views. Complications:            No immediate complications. Estimated Blood Loss:     Estimated blood loss: none. Impression:               - The entire examined colon is normal on direct and                            retroflexion views.                           - No specimens collected. Recommendation:           - Patient has a contact number available for                            emergencies. The signs and symptoms of potential                            delayed complications were discussed with the                            patient. Return to normal activities tomorrow.                            Written discharge instructions were provided to the                             patient.                           - Resume previous diet.                           - Continue present medications.                           - Repeat colonoscopy in 10 years for screening                            purposes.  L. Myrtie Neither, MD 04/21/2017 8:21:09 AM This report has been signed electronically.

## 2017-04-21 NOTE — Progress Notes (Signed)
Pt's states no medical or surgical changes since previsit or office visit. 

## 2017-04-21 NOTE — Patient Instructions (Signed)
**   Normal Colonoscopy ! Repeat in 10 years :)   YOU HAD AN ENDOSCOPIC PROCEDURE TODAY AT THE [facility]:   Refer to the procedure report that was given to you for any specific questions about what was found during the examination.  If the procedure report does not answer your questions, please call your gastroenterologist to clarify.  If you requested that your care partner not be given the details of your procedure findings, then the procedure report has been included in a sealed envelope for you to review at your convenience later.  YOU SHOULD EXPECT: Some feelings of bloating in the abdomen. Passage of more gas than usual.  Walking can help get rid of the air that was put into your GI tract during the procedure and reduce the bloating. If you had a lower endoscopy (such as a colonoscopy or flexible sigmoidoscopy) you may notice spotting of blood in your stool or on the toilet paper. If you underwent a bowel prep for your procedure, you may not have a normal bowel movement for a few days.  Please Note:  You might notice some irritation and congestion in your nose or some drainage.  This is from the oxygen used during your procedure.  There is no need for concern and it should clear up in a day or so.  SYMPTOMS TO REPORT IMMEDIATELY:   Following lower endoscopy (colonoscopy or flexible sigmoidoscopy):  Excessive amounts of blood in the stool  Significant tenderness or worsening of abdominal pains  Swelling of the abdomen that is new, acute  Fever of 100F or higher  For urgent or emergent issues, a gastroenterologist can be reached at any hour by calling [phone].   DIET:  We do recommend a small meal at first, but then you may proceed to your regular diet.  Drink plenty of fluids but you should avoid alcoholic beverages for 24 hours.  ACTIVITY:  You should plan to take it easy for the rest of today and you should NOT DRIVE or use heavy machinery until tomorrow (because of  the sedation medicines used during the test).    FOLLOW UP: Our staff will call the number listed on your records the next business day following your procedure to check on you and address any questions or concerns that you may have regarding the information given to you following your procedure. If we do not reach you, we will leave a message.  However, if you are feeling well and you are not experiencing any problems, there is no need to return our call.  We will assume that you have returned to your regular daily activities without incident.  If any biopsies were taken you will be contacted by phone or by letter within the next 1-3 weeks.  Please call us at [phone] if you have not heard about the biopsies in 3 weeks.    SIGNATURES/CONFIDENTIALITY: You and/or your care partner have signed paperwork which will be entered into your electronic medical record.  These signatures attest to the fact that that the information above on your After Visit Summary has been reviewed and is understood.  Full responsibility of the confidentiality of this discharge information lies with you and/or your care-partner. 

## 2017-04-22 ENCOUNTER — Telehealth: Payer: Self-pay | Admitting: *Deleted

## 2017-04-22 NOTE — Telephone Encounter (Signed)
  Follow up Call-  Call back number 04/21/2017  Post procedure Call Back phone  # [phone]  Permission to leave phone message Yes  Some recent data might be hidden     Patient questions:  Do you have a fever, pain , or abdominal swelling? No. Pain Score  0 *  Have you tolerated food without any problems? Yes.    Have you been able to return to your normal activities? Yes.    Do you have any questions about your discharge instructions: Diet   No. Medications  No. Follow up visit  No.  Do you have questions or concerns about your Care? No.  Actions: * If pain score is 4 or above: No action needed, pain <4.

## 2019-03-21 ENCOUNTER — Telehealth: Payer: Self-pay

## 2019-03-21 DIAGNOSIS — N401 Enlarged prostate with lower urinary tract symptoms: Secondary | ICD-10-CM

## 2019-03-21 DIAGNOSIS — N138 Other obstructive and reflux uropathy: Secondary | ICD-10-CM

## 2019-03-21 DIAGNOSIS — E785 Hyperlipidemia, unspecified: Secondary | ICD-10-CM

## 2019-03-21 NOTE — Telephone Encounter (Signed)
Copied from [place] [phone]. Topic: General - Other >> Mar 20, 2019  4:35 PM Wynetta Emery, [place] C wrote: Reason for CRM: pt called in to schedule CPE. Pt would like to have labs before apt if possible   Please assist. >> Mar 20, 2019  4:59 PM Cox, Melburn Hake, CMA wrote: Please advise on labs.

## 2019-03-21 NOTE — Telephone Encounter (Signed)
Left a detailed message on verified voice mail.  Labs have been ordered. Needs a lab appointment. CRM created.

## 2019-03-21 NOTE — Telephone Encounter (Signed)
Yes we can do this. I put in future orders

## 2019-03-21 NOTE — Telephone Encounter (Signed)
Patient is scheduled for 04/27/2019. Ok to get labs before appointment?

## 2019-04-25 ENCOUNTER — Other Ambulatory Visit (INDEPENDENT_AMBULATORY_CARE_PROVIDER_SITE_OTHER): Payer: BC Managed Care – PPO

## 2019-04-25 ENCOUNTER — Other Ambulatory Visit: Payer: Self-pay

## 2019-04-25 DIAGNOSIS — N401 Enlarged prostate with lower urinary tract symptoms: Secondary | ICD-10-CM

## 2019-04-25 DIAGNOSIS — E785 Hyperlipidemia, unspecified: Secondary | ICD-10-CM

## 2019-04-25 DIAGNOSIS — N138 Other obstructive and reflux uropathy: Secondary | ICD-10-CM | POA: Diagnosis not present

## 2019-04-25 LAB — BASIC METABOLIC PANEL
BUN: 13 mg/dL (ref 6–23)
CO2: 30 mEq/L (ref 19–32)
Calcium: 9.7 mg/dL (ref 8.4–10.5)
Chloride: 101 mEq/L (ref 96–112)
Creatinine, Ser: 0.94 mg/dL (ref 0.40–1.50)
GFR: 83.64 mL/min (ref >60.00)
Glucose, Bld: 86 mg/dL (ref 70–99)
Potassium: 4.1 mEq/L (ref 3.5–5.1)
Sodium: 139 mEq/L (ref 135–145)

## 2019-04-25 LAB — POC URINALSYSI DIPSTICK (AUTOMATED)
Glucose, UA: NEGATIVE
Leukocytes, UA: NEGATIVE
Protein, UA: NEGATIVE
Spec Grav, UA: 1.025
Urobilinogen, UA: 0.2 U/dL
pH, UA: 6

## 2019-04-25 LAB — HEPATIC FUNCTION PANEL
ALT: 15 U/L (ref 0–53)
AST: 16 U/L (ref 0–37)
Albumin: 4.7 g/dL (ref 3.5–5.2)
Alkaline Phosphatase: 64 U/L (ref 39–117)
Bilirubin, Direct: 0.1 mg/dL (ref 0.0–0.3)
Total Bilirubin: 0.6 mg/dL (ref 0.2–1.2)
Total Protein: 7.4 g/dL (ref 6.0–8.3)

## 2019-04-25 LAB — LIPID PANEL
Cholesterol: 189 mg/dL (ref 0–200)
HDL: 48.5 mg/dL (ref >39.00)
LDL Cholesterol: 115 mg/dL — ABNORMAL HIGH (ref 0–99)
NonHDL: 140.99
Total CHOL/HDL Ratio: 4
Triglycerides: 128 mg/dL (ref 0.0–149.0)
VLDL: 25.6 mg/dL (ref 0.0–40.0)

## 2019-04-25 LAB — CBC WITH DIFFERENTIAL/PLATELET
Basophils Absolute: 0 K/uL (ref 0.0–0.1)
Basophils Relative: 0.7 % (ref 0.0–3.0)
Eosinophils Absolute: 0.4 K/uL (ref 0.0–0.7)
Eosinophils Relative: 6.2 % — ABNORMAL HIGH (ref 0.0–5.0)
HCT: 47 % (ref 39.0–52.0)
Hemoglobin: 15.6 g/dL (ref 13.0–17.0)
Lymphocytes Relative: 37.9 % (ref 12.0–46.0)
Lymphs Abs: 2.5 K/uL (ref 0.7–4.0)
MCHC: 33.2 g/dL (ref 30.0–36.0)
MCV: 93.3 fl (ref 78.0–100.0)
Monocytes Absolute: 0.8 K/uL (ref 0.1–1.0)
Monocytes Relative: 12.2 % — ABNORMAL HIGH (ref 3.0–12.0)
Neutro Abs: 2.8 K/uL (ref 1.4–7.7)
Neutrophils Relative %: 43 % (ref 43.0–77.0)
Platelets: 219 K/uL (ref 150.0–400.0)
RBC: 5.04 Mil/uL (ref 4.22–5.81)
RDW: 13 % (ref 11.5–15.5)
WBC: 6.5 K/uL (ref 4.0–10.5)

## 2019-04-25 LAB — PSA: PSA: 3.13 ng/mL (ref 0.10–4.00)

## 2019-04-25 LAB — TSH: TSH: 1.33 u[IU]/mL (ref 0.35–4.50)

## 2019-05-02 ENCOUNTER — Ambulatory Visit (INDEPENDENT_AMBULATORY_CARE_PROVIDER_SITE_OTHER): Payer: BC Managed Care – PPO | Admitting: Family Medicine

## 2019-05-02 ENCOUNTER — Other Ambulatory Visit: Payer: Self-pay

## 2019-05-02 ENCOUNTER — Encounter: Payer: Self-pay | Admitting: Family Medicine

## 2019-05-02 VITALS — BP 122/84 | HR 73 | Temp 97.2°F | Ht 73.0 in | Wt 183.0 lb

## 2019-05-02 DIAGNOSIS — Z0000 Encounter for general adult medical examination without abnormal findings: Secondary | ICD-10-CM

## 2019-05-02 DIAGNOSIS — Z23 Encounter for immunization: Secondary | ICD-10-CM

## 2019-05-02 NOTE — Addendum Note (Signed)
Addended by: Alverda Skeans on: 05/02/2019 09:22 AM   Modules accepted: Orders

## 2019-05-02 NOTE — Progress Notes (Signed)
   Subjective:    Patient ID: [name], male    DOB: [birth year], 53 y.o.   MRN: [id number]  HPI Here for a well exam. He feels great. His labs looked good except for mildly elevated cholesterol.    Review of Systems  Constitutional: Negative.   HENT: Negative.   Eyes: Negative.   Respiratory: Negative.   Cardiovascular: Negative.   Gastrointestinal: Negative.   Genitourinary: Negative.   Musculoskeletal: Negative.   Skin: Negative.   Neurological: Negative.   Psychiatric/Behavioral: Negative.        Objective:   Physical Exam Constitutional:      General: He is not in acute distress.    Appearance: He is well-developed. He is not diaphoretic.  HENT:     Head: Normocephalic and atraumatic.     Right Ear: External ear normal.     Left Ear: External ear normal.     Nose: Nose normal.     Mouth/Throat:     Pharynx: No oropharyngeal exudate.  Eyes:     General: No scleral icterus.       Right eye: No discharge.        Left eye: No discharge.     Conjunctiva/sclera: Conjunctivae normal.     Pupils: Pupils are equal, round, and reactive to light.  Neck:     Musculoskeletal: Neck supple.     Thyroid: No thyromegaly.     Vascular: No JVD.     Trachea: No tracheal deviation.  Cardiovascular:     Rate and Rhythm: Normal rate and regular rhythm.     Heart sounds: Normal heart sounds. No murmur. No friction rub. No gallop.   Pulmonary:     Effort: Pulmonary effort is normal. No respiratory distress.     Breath sounds: Normal breath sounds. No wheezing or rales.  Chest:     Chest wall: No tenderness.  Abdominal:     General: Bowel sounds are normal. There is no distension.     Palpations: Abdomen is soft. There is no mass.     Tenderness: There is no abdominal tenderness. There is no guarding or rebound.  Genitourinary:    Penis: Normal. No tenderness.      Scrotum/Testes: Normal.     Prostate: Normal.     Rectum: Normal. Guaiac result negative.   Musculoskeletal: Normal range of motion.        General: No tenderness.  Lymphadenopathy:     Cervical: No cervical adenopathy.  Skin:    General: Skin is warm and dry.     Coloration: Skin is not pale.     Findings: No erythema or rash.  Neurological:     Mental Status: He is alert and oriented to person, place, and time.     Cranial Nerves: No cranial nerve deficit.     Motor: No abnormal muscle tone.     Coordination: Coordination normal.     Deep Tendon Reflexes: Reflexes are normal and symmetric. Reflexes normal.  Psychiatric:        Behavior: Behavior normal.        Thought Content: Thought content normal.        Judgment: Judgment normal.           Assessment & Plan:  Well exam. We discussed diet and exercise. Given a flu shot.  Alysia Penna, MD

## 2020-06-30 DIAGNOSIS — L820 Inflamed seborrheic keratosis: Secondary | ICD-10-CM | POA: Diagnosis not present

## 2020-06-30 DIAGNOSIS — L570 Actinic keratosis: Secondary | ICD-10-CM | POA: Diagnosis not present

## 2020-06-30 DIAGNOSIS — D225 Melanocytic nevi of trunk: Secondary | ICD-10-CM | POA: Diagnosis not present

## 2020-06-30 DIAGNOSIS — L814 Other melanin hyperpigmentation: Secondary | ICD-10-CM | POA: Diagnosis not present

## 2020-06-30 DIAGNOSIS — D2262 Melanocytic nevi of left upper limb, including shoulder: Secondary | ICD-10-CM | POA: Diagnosis not present

## 2020-07-11 ENCOUNTER — Telehealth: Payer: Self-pay | Admitting: Family Medicine

## 2020-07-11 NOTE — Telephone Encounter (Signed)
Patient is calling and stated that he received both dosages of the Pfizer and developed a rash. Pt wanted to know if he should get the booster, please advise. CB is [phone]

## 2020-07-15 NOTE — Telephone Encounter (Signed)
Yes I think he should get the booster, but he should premedicate by taking 50 mg of Benadryl one hour before the shot is given

## 2020-07-15 NOTE — Telephone Encounter (Signed)
Patient is returning the call, please advise. CB is [phone]

## 2020-07-15 NOTE — Telephone Encounter (Signed)
Spoke with patient.  He received moderna booster and has had no reaction at this time.

## 2020-07-15 NOTE — Telephone Encounter (Signed)
Left a message for the pt to return my call.  

## 2020-11-13 ENCOUNTER — Encounter: Payer: Self-pay | Admitting: Family Medicine

## 2020-11-13 ENCOUNTER — Telehealth: Payer: BC Managed Care – PPO | Admitting: Family Medicine

## 2020-11-13 DIAGNOSIS — J3089 Other allergic rhinitis: Secondary | ICD-10-CM | POA: Diagnosis not present

## 2020-11-13 NOTE — Progress Notes (Signed)
Subjective:    Patient ID: [name], male    DOB: 08-15-1965, 55 y.o.   MRN: [id number]  HPI Virtual Visit via Video Note  I connected with the patient on 11/13/20 at 11:00 AM EDT by a video enabled telemedicine application and verified that I am speaking with the correct person using two identifiers.  Location patient: home Location provider:work or home office Persons participating in the virtual visit: patient, provider  I discussed the limitations of evaluation and management by telemedicine and the availability of in person appointments. The patient expressed understanding and agreed to proceed.   HPI: Here for a dry cough that started 3 weeks ago. At first he had sinus congestion and a ST along with the cough, but these symptoms resolved after a few days. He has never had a fever. No body aches or NVD. No loss of taste or smell. No one else in his family has been sick. Now he just has a dry tickling cough in the back of the throat. He found that taking Mucinex made the cough worse, and that taking Zyrtec seems to help.    ROS: See pertinent positives and negatives per HPI.  Past Medical History:  Diagnosis Date  . Hypercalcuria   . Kidney stones    sees Dr. Patsi Sears   . Osteopenia     Past Surgical History:  Procedure Laterality Date  . APPENDECTOMY    . COLONOSCOPY  04/21/2017   per Dr. Myrtie Neither, clear, repeat in 10 yrs   . LAPAROSCOPIC APPENDECTOMY N/A 10/18/2012   Procedure: APPENDECTOMY LAPAROSCOPIC;  Surgeon: Liz Malady, MD;  Location: [facility] OR;  Service: General;  Laterality: N/A;  . VASECTOMY    . WISDOM TOOTH EXTRACTION      Family History  Problem Relation Age of Onset  . Heart disease Father   . Hypertension Father   . Colon cancer Neg Hx   . Colon polyps Neg Hx   . Esophageal cancer Neg Hx   . Rectal cancer Neg Hx   . Stomach cancer Neg Hx      Current Outpatient Medications:  Marland Kitchen  Multiple Vitamin (MULTIVITAMIN) tablet, Take 1 tablet by  mouth daily., Disp: , Rfl:   EXAM:  VITALS per patient if applicable:  GENERAL: alert, oriented, appears well and in no acute distress  HEENT: atraumatic, conjunttiva clear, no obvious abnormalities on inspection of external nose and ears  NECK: normal movements of the head and neck  LUNGS: on inspection no signs of respiratory distress, breathing rate appears normal, no obvious gross SOB, gasping or wheezing  CV: no obvious cyanosis  MS: moves all visible extremities without noticeable abnormality  PSYCH/NEURO: pleasant and cooperative, no obvious depression or anxiety, speech and thought processing grossly intact  ASSESSMENT AND PLAN: Seasonal allergies, I advised him to increase the Zyrtec to twice a day for the next week or two. Drink plenty of water. If not better by next week, he will let us know and we will plan on trying a steroid taper pack at that point.  Gershon Crane, MD  Discussed the following assessment and plan:  No diagnosis found.     I discussed the assessment and treatment plan with the patient. The patient was provided an opportunity to ask questions and all were answered. The patient agreed with the plan and demonstrated an understanding of the instructions.   The patient was advised to call back or seek an in-person evaluation if the symptoms worsen or  if the condition fails to improve as anticipated.     Review of Systems     Objective:   Physical Exam        Assessment & Plan:

## 2021-06-12 ENCOUNTER — Encounter: Payer: Self-pay | Admitting: Family Medicine

## 2021-06-12 ENCOUNTER — Ambulatory Visit (INDEPENDENT_AMBULATORY_CARE_PROVIDER_SITE_OTHER): Payer: BC Managed Care – PPO | Admitting: Family Medicine

## 2021-06-12 VITALS — BP 120/80 | HR 71 | Temp 97.8°F | Ht 71.75 in | Wt 180.0 lb

## 2021-06-12 DIAGNOSIS — Z23 Encounter for immunization: Secondary | ICD-10-CM | POA: Diagnosis not present

## 2021-06-12 DIAGNOSIS — Z0000 Encounter for general adult medical examination without abnormal findings: Secondary | ICD-10-CM

## 2021-06-12 DIAGNOSIS — Z8249 Family history of ischemic heart disease and other diseases of the circulatory system: Secondary | ICD-10-CM

## 2021-06-12 DIAGNOSIS — Z125 Encounter for screening for malignant neoplasm of prostate: Secondary | ICD-10-CM | POA: Diagnosis not present

## 2021-06-12 LAB — HEPATIC FUNCTION PANEL
ALT: 20 U/L (ref 0–53)
AST: 18 U/L (ref 0–37)
Albumin: 4.7 g/dL (ref 3.5–5.2)
Alkaline Phosphatase: 65 U/L (ref 39–117)
Bilirubin, Direct: 0.1 mg/dL (ref 0.0–0.3)
Total Bilirubin: 0.6 mg/dL (ref 0.2–1.2)
Total Protein: 7.1 g/dL (ref 6.0–8.3)

## 2021-06-12 LAB — BASIC METABOLIC PANEL
BUN: 15 mg/dL (ref 6–23)
CO2: 32 mEq/L (ref 19–32)
Calcium: 9.6 mg/dL (ref 8.4–10.5)
Chloride: 102 mEq/L (ref 96–112)
Creatinine, Ser: 1.02 mg/dL (ref 0.40–1.50)
GFR: 82.39 mL/min (ref >60.00)
Glucose, Bld: 101 mg/dL — ABNORMAL HIGH (ref 70–99)
Potassium: 4.4 mEq/L (ref 3.5–5.1)
Sodium: 140 mEq/L (ref 135–145)

## 2021-06-12 LAB — LIPID PANEL
Cholesterol: 200 mg/dL (ref 0–200)
HDL: 49.9 mg/dL (ref >39.00)
LDL Cholesterol: 139 mg/dL — ABNORMAL HIGH (ref 0–99)
NonHDL: 149.71
Total CHOL/HDL Ratio: 4
Triglycerides: 54 mg/dL (ref 0.0–149.0)
VLDL: 10.8 mg/dL (ref 0.0–40.0)

## 2021-06-12 LAB — VITAMIN D 25 HYDROXY (VIT D DEFICIENCY, FRACTURES): VITD: 53.78 ng/mL (ref 30.00–100.00)

## 2021-06-12 LAB — CBC WITH DIFFERENTIAL/PLATELET
Basophils Absolute: 0 10*3/uL (ref 0.0–0.1)
Basophils Relative: 0.8 % (ref 0.0–3.0)
Eosinophils Absolute: 0.3 10*3/uL (ref 0.0–0.7)
Eosinophils Relative: 5 % (ref 0.0–5.0)
HCT: 45.6 % (ref 39.0–52.0)
Hemoglobin: 15.1 g/dL (ref 13.0–17.0)
Lymphocytes Relative: 28.6 % (ref 12.0–46.0)
Lymphs Abs: 1.5 10*3/uL (ref 0.7–4.0)
MCHC: 33.2 g/dL (ref 30.0–36.0)
MCV: 92.9 fl (ref 78.0–100.0)
Monocytes Absolute: 0.6 10*3/uL (ref 0.1–1.0)
Monocytes Relative: 12.1 % — ABNORMAL HIGH (ref 3.0–12.0)
Neutro Abs: 2.8 10*3/uL (ref 1.4–7.7)
Neutrophils Relative %: 53.5 % (ref 43.0–77.0)
Platelets: 216 10*3/uL (ref 150.0–400.0)
RBC: 4.91 Mil/uL (ref 4.22–5.81)
RDW: 13.2 % (ref 11.5–15.5)
WBC: 5.2 10*3/uL (ref 4.0–10.5)

## 2021-06-12 LAB — HEMOGLOBIN A1C: Hgb A1c MFr Bld: 5.7 % (ref 4.6–6.5)

## 2021-06-12 LAB — TESTOSTERONE: Testosterone: 440.87 ng/dL (ref 300.00–890.00)

## 2021-06-12 LAB — TSH: TSH: 1.01 u[IU]/mL (ref 0.35–5.50)

## 2021-06-12 LAB — PSA: PSA: 3.75 ng/mL (ref 0.10–4.00)

## 2021-06-12 NOTE — Progress Notes (Signed)
Subjective:    Patient ID: MASAYUKI SAKAI, male    DOB: 1966-02-28, 55 y.o.   MRN: [id number]  HPI Here for a well exam. He feels fine. He asks to check a testosterone level. He also asks about gauging his risk for heart disease. His father died of a heart attack at age 74.    Review of Systems  Constitutional: Negative.   HENT: Negative.    Eyes: Negative.   Respiratory: Negative.    Cardiovascular: Negative.   Gastrointestinal: Negative.   Genitourinary: Negative.   Musculoskeletal: Negative.   Skin: Negative.   Neurological: Negative.   Psychiatric/Behavioral: Negative.        Objective:   Physical Exam Constitutional:      General: He is not in acute distress.    Appearance: Normal appearance. He is well-developed. He is not diaphoretic.  HENT:     Head: Normocephalic and atraumatic.     Right Ear: External ear normal.     Left Ear: External ear normal.     Nose: Nose normal.     Mouth/Throat:     Pharynx: No oropharyngeal exudate.  Eyes:     General: No scleral icterus.       Right eye: No discharge.        Left eye: No discharge.     Conjunctiva/sclera: Conjunctivae normal.     Pupils: Pupils are equal, round, and reactive to light.  Neck:     Thyroid: No thyromegaly.     Vascular: No JVD.     Trachea: No tracheal deviation.  Cardiovascular:     Rate and Rhythm: Normal rate and regular rhythm.     Heart sounds: Normal heart sounds. No murmur heard.   No friction rub. No gallop.  Pulmonary:     Effort: Pulmonary effort is normal. No respiratory distress.     Breath sounds: Normal breath sounds. No wheezing or rales.  Chest:     Chest wall: No tenderness.  Abdominal:     General: Bowel sounds are normal. There is no distension.     Palpations: Abdomen is soft. There is no mass.     Tenderness: There is no abdominal tenderness. There is no guarding or rebound.  Genitourinary:    Penis: Normal. No tenderness.      Testes: Normal.     Prostate: Normal.      Rectum: Normal. Guaiac result negative.  Musculoskeletal:        General: No tenderness. Normal range of motion.     Cervical back: Neck supple.  Lymphadenopathy:     Cervical: No cervical adenopathy.  Skin:    General: Skin is warm and dry.     Coloration: Skin is not pale.     Findings: No erythema or rash.  Neurological:     Mental Status: He is alert and oriented to person, place, and time.     Cranial Nerves: No cranial nerve deficit.     Motor: No abnormal muscle tone.     Coordination: Coordination normal.     Deep Tendon Reflexes: Reflexes are normal and symmetric. Reflexes normal.  Psychiatric:        Behavior: Behavior normal.        Thought Content: Thought content normal.        Judgment: Judgment normal.          Assessment & Plan:  Well exam. We discussed diet and exercise. Get fasting labs. Set up a cardiac calcium score  CT. Gershon Crane, MD

## 2021-06-12 NOTE — Addendum Note (Signed)
Addended by: Carola Rhine on: 06/12/2021 08:43 AM   Modules accepted: Orders

## 2021-07-09 ENCOUNTER — Encounter: Payer: Self-pay | Admitting: Family Medicine

## 2021-07-13 ENCOUNTER — Other Ambulatory Visit: Payer: Self-pay

## 2021-07-13 ENCOUNTER — Ambulatory Visit (INDEPENDENT_AMBULATORY_CARE_PROVIDER_SITE_OTHER)
Admission: RE | Admit: 2021-07-13 | Discharge: 2021-07-13 | Disposition: A | Discharge status: Home / Self Care | Payer: Self-pay | Source: Ambulatory Visit | Attending: Family Medicine | Admitting: Family Medicine

## 2021-07-13 DIAGNOSIS — Z8249 Family history of ischemic heart disease and other diseases of the circulatory system: Secondary | ICD-10-CM

## 2021-07-13 MED ORDER — SILDENAFIL CITRATE 100 MG PO TABS: 100.0000 mg | ORAL_TABLET | ORAL | 5 refills | Status: DC | PRN

## 2021-07-13 NOTE — Telephone Encounter (Signed)
I sent in the Viagra

## 2021-07-14 ENCOUNTER — Encounter: Payer: Self-pay | Admitting: Family Medicine

## 2021-07-16 NOTE — Telephone Encounter (Signed)
I suggest he pay cash for the 4 tabs per month as you related

## 2022-04-19 DIAGNOSIS — H53143 Visual discomfort, bilateral: Secondary | ICD-10-CM | POA: Diagnosis not present

## 2023-03-09 ENCOUNTER — Encounter: Payer: Self-pay | Admitting: Family Medicine

## 2023-03-10 ENCOUNTER — Encounter (INDEPENDENT_AMBULATORY_CARE_PROVIDER_SITE_OTHER): Payer: Self-pay

## 2023-04-01 ENCOUNTER — Encounter: Payer: BC Managed Care – PPO | Admitting: Family Medicine

## 2023-04-08 ENCOUNTER — Encounter: Payer: BC Managed Care – PPO | Admitting: Family Medicine

## 2023-04-19 ENCOUNTER — Ambulatory Visit (INDEPENDENT_AMBULATORY_CARE_PROVIDER_SITE_OTHER): Payer: BC Managed Care – PPO | Admitting: Family Medicine

## 2023-04-19 ENCOUNTER — Encounter: Payer: Self-pay | Admitting: Family Medicine

## 2023-04-19 VITALS — BP 110/76 | HR 59 | Temp 98.2°F | Ht 72.0 in | Wt 183.0 lb

## 2023-04-19 DIAGNOSIS — Z0000 Encounter for general adult medical examination without abnormal findings: Secondary | ICD-10-CM | POA: Diagnosis not present

## 2023-04-19 DIAGNOSIS — Z13220 Encounter for screening for lipoid disorders: Secondary | ICD-10-CM | POA: Diagnosis not present

## 2023-04-19 DIAGNOSIS — Z125 Encounter for screening for malignant neoplasm of prostate: Secondary | ICD-10-CM | POA: Diagnosis not present

## 2023-04-19 DIAGNOSIS — Z131 Encounter for screening for diabetes mellitus: Secondary | ICD-10-CM | POA: Diagnosis not present

## 2023-04-19 LAB — PSA: PSA: 3.81 ng/mL (ref 0.10–4.00)

## 2023-04-19 LAB — BASIC METABOLIC PANEL
BUN: 15 mg/dL (ref 6–23)
CO2: 27 mEq/L (ref 19–32)
Calcium: 9.4 mg/dL (ref 8.4–10.5)
Chloride: 104 mEq/L (ref 96–112)
Creatinine, Ser: 1.03 mg/dL (ref 0.40–1.50)
GFR: 80.38 mL/min (ref >60.00)
Glucose, Bld: 94 mg/dL (ref 70–99)
Potassium: 4 mEq/L (ref 3.5–5.1)
Sodium: 139 mEq/L (ref 135–145)

## 2023-04-19 LAB — HEPATIC FUNCTION PANEL
ALT: 16 U/L (ref 0–53)
AST: 17 U/L (ref 0–37)
Albumin: 4.6 g/dL (ref 3.5–5.2)
Alkaline Phosphatase: 73 U/L (ref 39–117)
Bilirubin, Direct: 0.1 mg/dL (ref 0.0–0.3)
Total Bilirubin: 0.6 mg/dL (ref 0.2–1.2)
Total Protein: 7.2 g/dL (ref 6.0–8.3)

## 2023-04-19 LAB — LIPID PANEL
Cholesterol: 201 mg/dL — ABNORMAL HIGH (ref 0–200)
HDL: 47.8 mg/dL (ref >39.00)
LDL Cholesterol: 136 mg/dL — ABNORMAL HIGH (ref 0–99)
NonHDL: 153.25
Total CHOL/HDL Ratio: 4
Triglycerides: 87 mg/dL (ref 0.0–149.0)
VLDL: 17.4 mg/dL (ref 0.0–40.0)

## 2023-04-19 LAB — CBC WITH DIFFERENTIAL/PLATELET
Basophils Absolute: 0 10*3/uL (ref 0.0–0.1)
Basophils Relative: 0.8 % (ref 0.0–3.0)
Eosinophils Absolute: 0.3 10*3/uL (ref 0.0–0.7)
Eosinophils Relative: 6 % — ABNORMAL HIGH (ref 0.0–5.0)
HCT: 46.1 % (ref 39.0–52.0)
Hemoglobin: 15.1 g/dL (ref 13.0–17.0)
Lymphocytes Relative: 31.4 % (ref 12.0–46.0)
Lymphs Abs: 1.8 10*3/uL (ref 0.7–4.0)
MCHC: 32.7 g/dL (ref 30.0–36.0)
MCV: 93.6 fl (ref 78.0–100.0)
Monocytes Absolute: 0.6 10*3/uL (ref 0.1–1.0)
Monocytes Relative: 10.8 % (ref 3.0–12.0)
Neutro Abs: 2.9 10*3/uL (ref 1.4–7.7)
Neutrophils Relative %: 51 % (ref 43.0–77.0)
Platelets: 230 10*3/uL (ref 150.0–400.0)
RBC: 4.92 Mil/uL (ref 4.22–5.81)
RDW: 13.3 % (ref 11.5–15.5)
WBC: 5.7 10*3/uL (ref 4.0–10.5)

## 2023-04-19 LAB — HEMOGLOBIN A1C: Hgb A1c MFr Bld: 5.6 % (ref 4.6–6.5)

## 2023-04-19 LAB — TSH: TSH: 1.27 u[IU]/mL (ref 0.35–5.50)

## 2023-04-19 NOTE — Progress Notes (Signed)
Subjective:    Patient ID: [name], male    DOB: 02/13/1966, 57 y.o.   MRN: [id number]  HPI Here for a well exam. He feels great.    Review of Systems  Constitutional: Negative.   HENT: Negative.    Eyes: Negative.   Respiratory: Negative.    Cardiovascular: Negative.   Gastrointestinal: Negative.   Genitourinary: Negative.   Musculoskeletal: Negative.   Skin: Negative.   Neurological: Negative.   Psychiatric/Behavioral: Negative.         Objective:   Physical Exam Constitutional:      General: He is not in acute distress.    Appearance: Normal appearance. He is well-developed. He is not diaphoretic.  HENT:     Head: Normocephalic and atraumatic.     Right Ear: External ear normal.     Left Ear: External ear normal.     Nose: Nose normal.     Mouth/Throat:     Pharynx: No oropharyngeal exudate.  Eyes:     General: No scleral icterus.       Right eye: No discharge.        Left eye: No discharge.     Conjunctiva/sclera: Conjunctivae normal.     Pupils: Pupils are equal, round, and reactive to light.  Neck:     Thyroid: No thyromegaly.     Vascular: No JVD.     Trachea: No tracheal deviation.  Cardiovascular:     Rate and Rhythm: Normal rate and regular rhythm.     Pulses: Normal pulses.     Heart sounds: Normal heart sounds. No murmur heard.    No friction rub. No gallop.  Pulmonary:     Effort: Pulmonary effort is normal. No respiratory distress.     Breath sounds: Normal breath sounds. No wheezing or rales.  Chest:     Chest wall: No tenderness.  Abdominal:     General: Bowel sounds are normal. There is no distension.     Palpations: Abdomen is soft. There is no mass.     Tenderness: There is no abdominal tenderness. There is no guarding or rebound.  Genitourinary:    Penis: Normal. No tenderness.      Testes: Normal.     Prostate: Normal.     Rectum: Normal. Guaiac result negative.  Musculoskeletal:        General: No tenderness. Normal  range of motion.     Cervical back: Neck supple.  Lymphadenopathy:     Cervical: No cervical adenopathy.  Skin:    General: Skin is warm and dry.     Coloration: Skin is not pale.     Findings: No erythema or rash.  Neurological:     General: No focal deficit present.     Mental Status: He is alert and oriented to person, place, and time.     Cranial Nerves: No cranial nerve deficit.     Motor: No abnormal muscle tone.     Coordination: Coordination normal.     Deep Tendon Reflexes: Reflexes are normal and symmetric. Reflexes normal.  Psychiatric:        Mood and Affect: Mood normal.        Behavior: Behavior normal.        Thought Content: Thought content normal.        Judgment: Judgment normal.           Assessment & Plan:  Well exam. We discussed diet and exercise. Get fasting labs. Gershon Crane,  MD

## 2023-08-15 DIAGNOSIS — J208 Acute bronchitis due to other specified organisms: Secondary | ICD-10-CM | POA: Diagnosis not present

## 2024-01-05 ENCOUNTER — Encounter: Payer: Self-pay | Admitting: Family Medicine

## 2024-01-05 ENCOUNTER — Telehealth: Payer: Self-pay | Admitting: Family Medicine

## 2024-01-05 DIAGNOSIS — J40 Bronchitis, not specified as acute or chronic: Secondary | ICD-10-CM | POA: Diagnosis not present

## 2024-01-05 MED ORDER — AZITHROMYCIN 250 MG PO TABS: ORAL_TABLET | ORAL | 0 refills | Status: DC

## 2024-01-05 MED ORDER — TADALAFIL 20 MG PO TABS: 20.0000 mg | ORAL_TABLET | Freq: Every day | ORAL | 11 refills | Status: AC | PRN

## 2024-01-05 NOTE — Progress Notes (Signed)
   Subjective:    Patient ID: [name], male    DOB: Mar 02, 1966, 58 y.o.   MRN: [id number]  HPI Virtual Visit via Video Note  I connected with the patient on 01/05/24 at 10:45 AM EDT by a video enabled telemedicine application and verified that I am speaking with the correct person using two identifiers.  Location patient: home Location provider:work or home office Persons participating in the virtual visit: patient, provider  I discussed the limitations of evaluation and management by telemedicine and the availability of in person appointments. The patient expressed understanding and agreed to proceed.   HPI: Here for 3 days of fever to 101 degrees, chest congestion, and coughing up green sputum. No SOB. He tested negative for Covid last night. Drinking fluids and taking Tylenol  and Nyquil.    ROS: See pertinent positives and negatives per HPI.  Past Medical History:  Diagnosis Date   Hypercalcuria    Kidney stones    sees Dr. Arnetta Lank     Past Surgical History:  Procedure Laterality Date   APPENDECTOMY     COLONOSCOPY  04/21/2017   per Dr. Dominic Friendly, clear, repeat in 10 yrs    LAPAROSCOPIC APPENDECTOMY N/A 10/18/2012   Procedure: APPENDECTOMY LAPAROSCOPIC;  Surgeon: Cloyce Darby, MD;  Location: [facility] OR;  Service: General;  Laterality: N/A;   VASECTOMY     WISDOM TOOTH EXTRACTION      Family History  Problem Relation Age of Onset   Heart disease Father    Hypertension Father    Colon cancer Neg Hx    Colon polyps Neg Hx    Esophageal cancer Neg Hx    Rectal cancer Neg Hx    Stomach cancer Neg Hx      Current Outpatient Medications:    Multiple Vitamin (MULTIVITAMIN) tablet, Take 1 tablet by mouth daily., Disp: , Rfl:   EXAM:  VITALS per patient if applicable:  GENERAL: alert, oriented, appears well and in no acute distress  HEENT: atraumatic, conjunttiva clear, no obvious abnormalities on inspection of external nose and ears  NECK:  normal movements of the head and neck  LUNGS: on inspection no signs of respiratory distress, breathing rate appears normal, no obvious gross SOB, gasping or wheezing  CV: no obvious cyanosis  MS: moves all visible extremities without noticeable abnormality  PSYCH/NEURO: pleasant and cooperative, no obvious depression or anxiety, speech and thought processing grossly intact  ASSESSMENT AND PLAN: Bronchitis. Treat with a Zpack.  Corita Diego, MD  Discussed the following assessment and plan:  No diagnosis found.     I discussed the assessment and treatment plan with the patient. The patient was provided an opportunity to ask questions and all were answered. The patient agreed with the plan and demonstrated an understanding of the instructions.   The patient was advised to call back or seek an in-person evaluation if the symptoms worsen or if the condition fails to improve as anticipated.      Review of Systems     Objective:   Physical Exam        Assessment & Plan:

## 2024-01-10 DIAGNOSIS — L821 Other seborrheic keratosis: Secondary | ICD-10-CM | POA: Diagnosis not present

## 2024-01-10 DIAGNOSIS — L814 Other melanin hyperpigmentation: Secondary | ICD-10-CM | POA: Diagnosis not present

## 2024-01-10 DIAGNOSIS — L570 Actinic keratosis: Secondary | ICD-10-CM | POA: Diagnosis not present

## 2024-01-10 DIAGNOSIS — I788 Other diseases of capillaries: Secondary | ICD-10-CM | POA: Diagnosis not present

## 2024-01-10 NOTE — Telephone Encounter (Signed)
 I did NOT forget to send directions. It said take it as directed. The pack has directions on it

## 2024-01-20 ENCOUNTER — Ambulatory Visit: Admitting: Family Medicine

## 2024-01-23 ENCOUNTER — Ambulatory Visit (INDEPENDENT_AMBULATORY_CARE_PROVIDER_SITE_OTHER): Payer: Self-pay | Admitting: Family Medicine

## 2024-01-23 VITALS — BP 110/74 | HR 63 | Temp 98.2°F | Wt 182.0 lb

## 2024-01-23 DIAGNOSIS — J40 Bronchitis, not specified as acute or chronic: Secondary | ICD-10-CM | POA: Diagnosis not present

## 2024-01-23 MED ORDER — HYDROCODONE BIT-HOMATROP MBR 5-1.5 MG/5ML PO SOLN: 5.0000 mL | ORAL | 0 refills | Status: DC | PRN

## 2024-01-23 MED ORDER — METHYLPREDNISOLONE 4 MG PO TBPK: ORAL_TABLET | ORAL | 0 refills | Status: DC

## 2024-01-23 NOTE — Progress Notes (Signed)
   Subjective:    Patient ID: [name], male    DOB: 1965-12-01, 58 y.o.   MRN: [id number]  HPI Here to follow up on a bronchitis. He had a virtual visit with us  on 01-05-24, and we gave him a Zpack. He took this and felt better, but the symptoms were not gone. He had a fever at first and was coughing up green sputum, but at this point these had resolved. He still had a dry cough, so he started taking a second Zpack 3 days ago. He has no SOB. He is concerned because he is leaving for a trip to [place] later this week.    Review of Systems  Constitutional: Negative.   HENT: Negative.    Eyes: Negative.   Respiratory:  Positive for cough. Negative for shortness of breath and wheezing.        Objective:   Physical Exam Constitutional:      Appearance: Normal appearance.  HENT:     Right Ear: Tympanic membrane, ear canal and external ear normal.     Left Ear: Tympanic membrane, ear canal and external ear normal.     Nose: Nose normal.     Mouth/Throat:     Pharynx: Oropharynx is clear.   Eyes:     Conjunctiva/sclera: Conjunctivae normal.   Pulmonary:     Effort: Pulmonary effort is normal.     Breath sounds: Normal breath sounds.  Lymphadenopathy:     Cervical: No cervical adenopathy.   Neurological:     Mental Status: He is alert.           Assessment & Plan:  Partially treated bronchitis. He will finish the Zpack, and we will provide him with a Medrol dose pack and some cough syrup. Recheck as needed.  Garnette Olmsted, MD

## 2024-06-06 ENCOUNTER — Encounter: Payer: Self-pay | Admitting: Family Medicine

## 2024-06-06 ENCOUNTER — Ambulatory Visit: Payer: Self-pay | Admitting: Family Medicine

## 2024-06-06 ENCOUNTER — Ambulatory Visit (INDEPENDENT_AMBULATORY_CARE_PROVIDER_SITE_OTHER): Admitting: Family Medicine

## 2024-06-06 VITALS — BP 110/64 | HR 58 | Temp 98.0°F | Wt 179.4 lb

## 2024-06-06 DIAGNOSIS — Z0000 Encounter for general adult medical examination without abnormal findings: Secondary | ICD-10-CM | POA: Diagnosis not present

## 2024-06-06 DIAGNOSIS — Z23 Encounter for immunization: Secondary | ICD-10-CM

## 2024-06-06 LAB — BASIC METABOLIC PANEL WITH GFR
BUN: 19 mg/dL (ref 6–23)
CO2: 31 meq/L (ref 19–32)
Calcium: 9.8 mg/dL (ref 8.4–10.5)
Chloride: 100 meq/L (ref 96–112)
Creatinine, Ser: 1.03 mg/dL (ref 0.40–1.50)
GFR: 79.74 mL/min (ref >60.00)
Glucose, Bld: 86 mg/dL (ref 70–99)
Potassium: 4.3 meq/L (ref 3.5–5.1)
Sodium: 139 meq/L (ref 135–145)

## 2024-06-06 LAB — LIPID PANEL
Cholesterol: 200 mg/dL (ref 0–200)
HDL: 52.1 mg/dL (ref >39.00)
LDL Cholesterol: 130 mg/dL — ABNORMAL HIGH (ref 0–99)
NonHDL: 147.8
Total CHOL/HDL Ratio: 4
Triglycerides: 87 mg/dL (ref 0.0–149.0)
VLDL: 17.4 mg/dL (ref 0.0–40.0)

## 2024-06-06 LAB — HEPATIC FUNCTION PANEL
ALT: 19 U/L (ref 0–53)
AST: 19 U/L (ref 0–37)
Albumin: 4.9 g/dL (ref 3.5–5.2)
Alkaline Phosphatase: 72 U/L (ref 39–117)
Bilirubin, Direct: 0.1 mg/dL (ref 0.0–0.3)
Total Bilirubin: 0.6 mg/dL (ref 0.2–1.2)
Total Protein: 7.5 g/dL (ref 6.0–8.3)

## 2024-06-06 LAB — CBC WITH DIFFERENTIAL/PLATELET
Basophils Absolute: 0.1 K/uL (ref 0.0–0.1)
Basophils Relative: 1 % (ref 0.0–3.0)
Eosinophils Absolute: 0.3 K/uL (ref 0.0–0.7)
Eosinophils Relative: 5.1 % — ABNORMAL HIGH (ref 0.0–5.0)
HCT: 46.7 % (ref 39.0–52.0)
Hemoglobin: 15.5 g/dL (ref 13.0–17.0)
Lymphocytes Relative: 29.9 % (ref 12.0–46.0)
Lymphs Abs: 1.7 K/uL (ref 0.7–4.0)
MCHC: 33.2 g/dL (ref 30.0–36.0)
MCV: 92.4 fl (ref 78.0–100.0)
Monocytes Absolute: 0.8 K/uL (ref 0.1–1.0)
Monocytes Relative: 13.9 % — ABNORMAL HIGH (ref 3.0–12.0)
Neutro Abs: 2.9 K/uL (ref 1.4–7.7)
Neutrophils Relative %: 50.1 % (ref 43.0–77.0)
Platelets: 219 K/uL (ref 150.0–400.0)
RBC: 5.05 Mil/uL (ref 4.22–5.81)
RDW: 13.4 % (ref 11.5–15.5)
WBC: 5.8 K/uL (ref 4.0–10.5)

## 2024-06-06 LAB — HEMOGLOBIN A1C: Hgb A1c MFr Bld: 5.7 % (ref 4.6–6.5)

## 2024-06-06 LAB — PSA: PSA: 3.68 ng/mL (ref 0.10–4.00)

## 2024-06-06 LAB — TSH: TSH: 0.94 u[IU]/mL (ref 0.35–5.50)

## 2024-06-06 NOTE — Progress Notes (Signed)
 Subjective:    Patient ID: BARRETT GOLDIE, male    DOB: [birth year], 58 y.o.   MRN: [id number]  HPI Here for a well exam. He feels well.    Review of Systems  Constitutional: Negative.   HENT: Negative.    Eyes: Negative.   Respiratory: Negative.    Cardiovascular: Negative.   Gastrointestinal: Negative.   Genitourinary: Negative.   Musculoskeletal: Negative.   Skin: Negative.   Neurological: Negative.   Psychiatric/Behavioral: Negative.         Objective:   Physical Exam Constitutional:      General: He is not in acute distress.    Appearance: Normal appearance. He is well-developed. He is not diaphoretic.  HENT:     Head: Normocephalic and atraumatic.     Right Ear: External ear normal.     Left Ear: External ear normal.     Nose: Nose normal.     Mouth/Throat:     Pharynx: No oropharyngeal exudate.  Eyes:     General: No scleral icterus.       Right eye: No discharge.        Left eye: No discharge.     Conjunctiva/sclera: Conjunctivae normal.     Pupils: Pupils are equal, round, and reactive to light.  Neck:     Thyroid : No thyromegaly.     Vascular: No JVD.     Trachea: No tracheal deviation.  Cardiovascular:     Rate and Rhythm: Normal rate and regular rhythm.     Pulses: Normal pulses.     Heart sounds: Normal heart sounds. No murmur heard.    No friction rub. No gallop.  Pulmonary:     Effort: Pulmonary effort is normal. No respiratory distress.     Breath sounds: Normal breath sounds. No wheezing or rales.  Chest:     Chest wall: No tenderness.  Abdominal:     General: Bowel sounds are normal. There is no distension.     Palpations: Abdomen is soft. There is no mass.     Tenderness: There is no abdominal tenderness. There is no guarding or rebound.  Genitourinary:    Penis: Normal. No tenderness.      Testes: Normal.     Prostate: Normal.     Rectum: Normal. Guaiac result negative.  Musculoskeletal:        General: No tenderness. Normal range  of motion.     Cervical back: Neck supple.  Lymphadenopathy:     Cervical: No cervical adenopathy.  Skin:    General: Skin is warm and dry.     Coloration: Skin is not pale.     Findings: No erythema or rash.  Neurological:     General: No focal deficit present.     Mental Status: He is alert and oriented to person, place, and time.     Cranial Nerves: No cranial nerve deficit.     Motor: No abnormal muscle tone.     Coordination: Coordination normal.     Deep Tendon Reflexes: Reflexes are normal and symmetric. Reflexes normal.  Psychiatric:        Mood and Affect: Mood normal.        Behavior: Behavior normal.        Thought Content: Thought content normal.        Judgment: Judgment normal.           Assessment & Plan:  Well exam. We discussed diet and exercise. Get fasting labs. Garnette Olmsted,  MD

## 2024-06-06 NOTE — Addendum Note (Signed)
 Addended by: LADONNA INOCENTE SAILOR on: 06/06/2024 01:29 PM   Modules accepted: Orders
